# Patient Record
Sex: Male | Born: 1985 | Race: Black or African American | Hispanic: No | Marital: Single | Smoking: Current every day smoker
Health system: Southern US, Community
[De-identification: ages and names within clinical notes are randomized; demographics above are authoritative.]

## PROBLEM LIST (undated history)

## (undated) DIAGNOSIS — S022XXA Fracture of nasal bones, initial encounter for closed fracture: Secondary | ICD-10-CM

## (undated) DIAGNOSIS — E669 Obesity, unspecified: Secondary | ICD-10-CM

## (undated) DIAGNOSIS — I10 Essential (primary) hypertension: Secondary | ICD-10-CM

## (undated) DIAGNOSIS — Z972 Presence of dental prosthetic device (complete) (partial): Secondary | ICD-10-CM

## (undated) DIAGNOSIS — E119 Type 2 diabetes mellitus without complications: Secondary | ICD-10-CM

## (undated) DIAGNOSIS — S50311A Abrasion of right elbow, initial encounter: Secondary | ICD-10-CM

## (undated) HISTORY — DX: Essential (primary) hypertension: I10

## (undated) HISTORY — PX: TONSILLECTOMY: SUR1361

---

## 2008-05-30 ENCOUNTER — Emergency Department (HOSPITAL_BASED_OUTPATIENT_CLINIC_OR_DEPARTMENT_OTHER): Admission: EM | Admit: 2008-05-30 | Discharge: 2008-05-30 | Payer: Self-pay | Admitting: Emergency Medicine

## 2008-05-30 ENCOUNTER — Ambulatory Visit: Payer: Self-pay | Admitting: Diagnostic Radiology

## 2010-04-12 LAB — BASIC METABOLIC PANEL
BUN: 8 mg/dL (ref 6–23)
CO2: 29 mEq/L (ref 19–32)
Calcium: 9.6 mg/dL (ref 8.4–10.5)
Chloride: 104 mEq/L (ref 96–112)
Creatinine, Ser: 0.9 mg/dL (ref 0.4–1.5)

## 2010-04-12 LAB — CBC
MCHC: 33.3 g/dL (ref 30.0–36.0)
MCV: 87.8 fL (ref 78.0–100.0)
Platelets: 226 10*3/uL (ref 150–400)

## 2010-04-12 LAB — DIFFERENTIAL
Basophils Absolute: 0 10*3/uL (ref 0.0–0.1)
Basophils Relative: 1 % (ref 0–1)
Eosinophils Absolute: 0.2 10*3/uL (ref 0.0–0.7)
Neutrophils Relative %: 51 % (ref 43–77)

## 2010-04-12 LAB — POCT CARDIAC MARKERS: Myoglobin, poc: 30 ng/mL (ref 12–200)

## 2012-05-24 ENCOUNTER — Ambulatory Visit (INDEPENDENT_AMBULATORY_CARE_PROVIDER_SITE_OTHER): Payer: 59 | Admitting: Family Medicine

## 2012-05-24 ENCOUNTER — Encounter: Payer: Self-pay | Admitting: Family Medicine

## 2012-05-24 VITALS — BP 146/95 | HR 99 | Wt 294.0 lb

## 2012-05-24 DIAGNOSIS — R5383 Other fatigue: Secondary | ICD-10-CM

## 2012-05-24 DIAGNOSIS — E162 Hypoglycemia, unspecified: Secondary | ICD-10-CM

## 2012-05-24 DIAGNOSIS — Z13220 Encounter for screening for lipoid disorders: Secondary | ICD-10-CM

## 2012-05-24 DIAGNOSIS — R5381 Other malaise: Secondary | ICD-10-CM

## 2012-05-24 DIAGNOSIS — F17200 Nicotine dependence, unspecified, uncomplicated: Secondary | ICD-10-CM

## 2012-05-24 LAB — LIPID PANEL
Cholesterol: 219 mg/dL — ABNORMAL HIGH (ref 0–200)
HDL: 48 mg/dL (ref >39)
LDL Cholesterol: 147 mg/dL — ABNORMAL HIGH (ref 0–99)
Total CHOL/HDL Ratio: 4.6 Ratio
Triglycerides: 119 mg/dL (ref <150)
VLDL: 24 mg/dL (ref 0–40)

## 2012-05-24 LAB — CBC WITH DIFFERENTIAL/PLATELET
Basophils Absolute: 0 10*3/uL (ref 0.0–0.1)
Basophils Relative: 0 % (ref 0–1)
Eosinophils Absolute: 0.1 10*3/uL (ref 0.0–0.7)
Eosinophils Relative: 1 % (ref 0–5)
HCT: 48.4 % (ref 39.0–52.0)
Hemoglobin: 16.8 g/dL (ref 13.0–17.0)
Lymphocytes Relative: 33 % (ref 12–46)
Lymphs Abs: 3.2 10*3/uL (ref 0.7–4.0)
MCH: 28.9 pg (ref 26.0–34.0)
MCHC: 34.7 g/dL (ref 30.0–36.0)
MCV: 83.2 fL (ref 78.0–100.0)
Monocytes Absolute: 0.7 10*3/uL (ref 0.1–1.0)
Monocytes Relative: 7 % (ref 3–12)
Neutro Abs: 5.7 10*3/uL (ref 1.7–7.7)
Neutrophils Relative %: 59 % (ref 43–77)
Platelets: 270 10*3/uL (ref 150–400)
RBC: 5.82 MIL/uL — ABNORMAL HIGH (ref 4.22–5.81)
RDW: 13.9 % (ref 11.5–15.5)
WBC: 9.8 10*3/uL (ref 4.0–10.5)

## 2012-05-24 LAB — COMPLETE METABOLIC PANEL WITH GFR
ALT: 29 U/L (ref 0–53)
AST: 17 U/L (ref 0–37)
Albumin: 4.4 g/dL (ref 3.5–5.2)
Alkaline Phosphatase: 69 U/L (ref 39–117)
BUN: 9 mg/dL (ref 6–23)
CO2: 28 mEq/L (ref 19–32)
Calcium: 10.2 mg/dL (ref 8.4–10.5)
Chloride: 103 mEq/L (ref 96–112)
Creat: 1.03 mg/dL (ref 0.50–1.35)
GFR, Est African American: >89 mL/min
GFR, Est Non African American: >89 mL/min
Glucose, Bld: 90 mg/dL (ref 70–99)
Potassium: 4.6 mEq/L (ref 3.5–5.3)
Sodium: 140 mEq/L (ref 135–145)
Total Bilirubin: 0.8 mg/dL (ref 0.3–1.2)
Total Protein: 7.8 g/dL (ref 6.0–8.3)

## 2012-05-24 LAB — HEMOGLOBIN A1C
Hgb A1c MFr Bld: 5.1 % (ref <5.7)
Mean Plasma Glucose: 100 mg/dL (ref <117)

## 2012-05-24 LAB — TSH: TSH: 1.869 u[IU]/mL (ref 0.350–4.500)

## 2012-05-24 MED ORDER — VARENICLINE TARTRATE 0.5 MG X 11 & 1 MG X 42 PO MISC: ORAL | Status: DC

## 2012-05-24 NOTE — Patient Instructions (Addendum)
High Protein Diet A high protein diet means that high protein foods are added to your diet. Getting more protein in the diet is important for a number of reasons. Protein helps the body to build tissue, muscle, and to repair damage. People who have had surgery, injuries such as broken bones, infections, and burns, or illnesses such as cancer, may need more protein in their diet.  SERVING SIZES Measuring foods and serving sizes helps to make sure you are getting the right amount of food. The list below tells how big or small some common serving sizes are.   1 oz.........4 stacked dice.  3 oz.........Deck of cards.  1 tsp........Tip of little finger.  1 tbs........Thumb.  2 tbs........Golf ball.   cup.......Half of a fist.  1 cup........A fist. FOOD SOURCES OF PROTEIN Listed below are some food sources of protein and the amount of protein they contain. Your Registered Dietitian can calculate how many grams of protein you need for your medical condition. High protein foods can be added to the diet at mealtime or as snacks. Be sure to have at least 1 protein-containing food at each meal and snack to ensure adequate intake.  Meats and Meat Substitutes / Protein (g)  3 oz poultry (chicken, turkey) / 26 g  3 oz tuna, canned in water / 26 g  3 oz fish (cod) / 21 g  3 oz red meat (beef, pork) / 21 g  4 oz tofu / 9 g  1 egg / 6 g   cup egg substitute / 5 g  1 cup dried beans / 15 g  1 cup soy milk / 4 g Dairy / Protein (g)  1 cup milk (skim, 1%, 2%, whole) / 8 g   cup evaporated milk / 9 g  1 cup buttermilk / 8 g  1 cup low-fat plain yogurt / 11 g  1 cup regular plain yogurt / 9 g   cup cottage cheese / 14 g  1 oz cheddar cheese / 7 g Nuts / Protein (g)  2 tbs peanut butter / 8 g  1 oz peanuts / 7 g  2 tbs cashews / 5 g  2 tbs almonds / 5 g Document Released: 12/19/2004 Document Revised: 03/13/2011 Document Reviewed: 09/21/2006 ExitCare Patient Information  2014 [employer].  

## 2012-05-24 NOTE — Progress Notes (Signed)
  Subjective:    Patient ID: Eddie [name], male    DOB: 1985-12-30, 27 y.o.   MRN: [id number]  HPI:  The patient is here to establish care with our office.  He has a couple of issues he would like to address.    1)  Enlarged Lymph Node:  [place] noticed an enlarged lymph node behind his right ear lobe last week.  It went away after he made this appointment.    2)  Blood Sugar:  He is concerned about his blood sugar.  He says that his blood sugar drops and he has to drink orange juice to bring it up.    Review of Systems  Constitutional: Positive for fatigue and unexpected weight change. Negative for activity change and appetite change.  HENT: Negative.        Enlarged lymph node (right ear)   Eyes: Negative.   Respiratory: Negative for cough, chest tightness and shortness of breath.   Cardiovascular: Negative for chest pain, palpitations and leg swelling.  Gastrointestinal: Negative for abdominal pain, diarrhea, constipation and blood in stool.  Endocrine: Negative for polydipsia, polyphagia and polyuria.       Low blood sugar  Genitourinary: Negative for difficulty urinating.  Musculoskeletal: Negative for myalgias, joint swelling and arthralgias.  Skin: Negative for rash.  Neurological: Negative for dizziness, numbness and headaches.  Hematological: Negative.   Psychiatric/Behavioral: Negative.    Past Medical History  Diagnosis Date  . Hypertension   . Morbid obesity    Family History  Problem Relation Age of Onset  . Diabetes Mother   . Hypertension Mother   . Hypertension Father   . Cancer Father     Prostate   . Stroke Father   . 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  Morbidly obese   HENT:  Head: Normocephalic.  Right Ear: External ear normal.  Left Ear: External ear normal.  Nose: Nose normal.  Mouth/Throat: Oropharynx is clear and moist.  No node enlargement  Eyes: Conjunctivae are normal. No scleral icterus.  Neck: Neck supple. No thyromegaly present.  Cardiovascular: Normal rate, regular rhythm and normal heart sounds.   Pulmonary/Chest: Effort normal and breath sounds normal.  Abdominal: Soft. He exhibits no mass. There is no tenderness.  Musculoskeletal: Normal range of motion.  Lymphadenopathy:    He has no cervical adenopathy.  Neurological: He is alert.  Skin: Skin is warm and dry. No rash noted.  Psychiatric: He has a normal mood and affect. His behavior is normal. Judgment and thought content normal.          Assessment & Plan:

## 2012-06-16 ENCOUNTER — Encounter: Payer: Self-pay | Admitting: Family Medicine

## 2012-06-16 DIAGNOSIS — E162 Hypoglycemia, unspecified: Secondary | ICD-10-CM | POA: Insufficient documentation

## 2012-06-16 DIAGNOSIS — R5383 Other fatigue: Secondary | ICD-10-CM | POA: Insufficient documentation

## 2012-06-16 DIAGNOSIS — R5381 Other malaise: Secondary | ICD-10-CM | POA: Insufficient documentation

## 2012-06-16 DIAGNOSIS — F17200 Nicotine dependence, unspecified, uncomplicated: Secondary | ICD-10-CM | POA: Insufficient documentation

## 2012-06-16 DIAGNOSIS — Z13220 Encounter for screening for lipoid disorders: Secondary | ICD-10-CM | POA: Insufficient documentation

## 2012-06-16 NOTE — Assessment & Plan Note (Signed)
Checking a lipid panel.  

## 2012-06-16 NOTE — Assessment & Plan Note (Signed)
He is to work harder on diet and exercise.

## 2012-06-16 NOTE — Assessment & Plan Note (Signed)
Checking his blood sugar.  He was encouraged to increase his intake of protein.

## 2012-06-16 NOTE — Assessment & Plan Note (Signed)
Checking a TSH and CBC.   

## 2012-06-16 NOTE — Assessment & Plan Note (Signed)
He was given a prescription for Chantix.

## 2012-11-30 ENCOUNTER — Encounter (HOSPITAL_BASED_OUTPATIENT_CLINIC_OR_DEPARTMENT_OTHER): Payer: Self-pay | Admitting: Emergency Medicine

## 2012-11-30 ENCOUNTER — Emergency Department (HOSPITAL_BASED_OUTPATIENT_CLINIC_OR_DEPARTMENT_OTHER)
Admission: EM | Admit: 2012-11-30 | Discharge: 2012-11-30 | Disposition: A | Discharge status: Home / Self Care | Payer: 59 | Attending: Emergency Medicine | Admitting: Emergency Medicine

## 2012-11-30 DIAGNOSIS — F17200 Nicotine dependence, unspecified, uncomplicated: Secondary | ICD-10-CM | POA: Insufficient documentation

## 2012-11-30 DIAGNOSIS — J029 Acute pharyngitis, unspecified: Secondary | ICD-10-CM

## 2012-11-30 LAB — GLUCOSE, CAPILLARY: Glucose-Capillary: 104 mg/dL — ABNORMAL HIGH (ref 70–99)

## 2012-11-30 LAB — RAPID STREP SCREEN (MED CTR MEBANE ONLY): Streptococcus, Group A Screen (Direct): NEGATIVE

## 2012-11-30 NOTE — ED Provider Notes (Signed)
CSN: [id number]     Arrival date & time 11/30/12  1309 History   First MD Initiated Contact with Patient 11/30/12 1448     Chief Complaint  Patient presents with  . Sore Throat  . URI   (Consider location/radiation/quality/duration/timing/severity/associated sxs/prior Treatment) Patient is a 26 y.o. male presenting with pharyngitis. The history is provided by the patient. No language interpreter was used.  Sore Throat This is a new problem. The current episode started today. The problem occurs constantly. The problem has been gradually worsening. Associated symptoms include a sore throat. Pertinent negatives include no fever. Nothing aggravates the symptoms. He has tried nothing for the symptoms. The treatment provided moderate relief.   Pt here with Mother.  She is worried about pt's heart and about diabetes.   Pt has had a low glucose in the past and chest pain in the past.   Pt reports he had pain in his chest several weeks ago History reviewed. No pertinent past medical history. Past Surgical History  Procedure Laterality Date  . Tonsillectomy      When he was a child   Family History  Problem Relation Age of Onset  . Diabetes Mother   . Hypertension Mother   . Hypertension Father   . Cancer Father     Prostate   . Stroke Father   . Diabetes Maternal Grandmother    History  Substance Use Topics  . Smoking status: Current Every Day Smoker -- 0.50 packs/day    Types: Cigarettes  . Smokeless tobacco: Not on file     Comment: He is ready to quit.   . Alcohol Use: 1.8 oz/week    3 Glasses of wine per week     Comment: socially    Review of Systems  Constitutional: Negative for fever.  HENT: Positive for sore throat.   All other systems reviewed and are negative.    Allergies  Review of patient's allergies indicates no known allergies.  Home Medications  No current outpatient prescriptions on file. BP 153/92  Pulse 98  Temp(Src) 98.9 F (37.2 C) (Oral)  Resp 18   Ht 5\' 10"  (1.778 m)  Wt 320 lb (145.151 kg)  BMI 45.92 kg/m2  SpO2 99% Physical Exam  Nursing note and vitals reviewed. Constitutional: He is oriented to person, place, and time. He appears well-developed and well-nourished.  HENT:  Head: Normocephalic.  Eyes: Conjunctivae and EOM are normal. Pupils are equal, round, and reactive to light.  Neck: Normal range of motion. Neck supple.  Cardiovascular: Normal rate and normal heart sounds.   Pulmonary/Chest: Effort normal.  Abdominal: Soft. He exhibits no distension.  Musculoskeletal: Normal range of motion.  Neurological: He is alert and oriented to person, place, and time.  Skin: Skin is warm.  Psychiatric: He has a normal mood and affect.    ED Course  Procedures (including critical care time) Labs Review Labs Reviewed - No data to display Imaging Review No results found.  EKG Interpretation   None       MDM   1. Pharyngitis    Strep is negative,  cbg is 104.    I suspect viral illness.   Pt advised to see Dr. Alberteen Sam for complete PE.     Lonia Skinner [place], PA-C 11/30/12 [phone]

## 2012-11-30 NOTE — ED Provider Notes (Signed)
Medical screening examination/treatment/procedure(s) were performed by non-physician practitioner and as supervising physician I was immediately available for consultation/collaboration.  EKG Interpretation   None        Ethelda Chick, MD 11/30/12 [phone]

## 2012-11-30 NOTE — ED Notes (Signed)
Pt reports cough, nasal congestion and sore throat worse at night since Monday.

## 2012-12-02 LAB — CULTURE, GROUP A STREP

## 2012-12-04 ENCOUNTER — Ambulatory Visit: Payer: Self-pay | Admitting: Family Medicine

## 2012-12-11 ENCOUNTER — Ambulatory Visit (INDEPENDENT_AMBULATORY_CARE_PROVIDER_SITE_OTHER): Payer: 59 | Admitting: Family Medicine

## 2012-12-11 ENCOUNTER — Encounter: Payer: Self-pay | Admitting: Family Medicine

## 2012-12-11 VITALS — BP 146/99 | HR 93 | Resp 16 | Ht 69.5 in | Wt 308.0 lb

## 2012-12-11 DIAGNOSIS — Z23 Encounter for immunization: Secondary | ICD-10-CM

## 2012-12-11 DIAGNOSIS — E785 Hyperlipidemia, unspecified: Secondary | ICD-10-CM

## 2012-12-11 DIAGNOSIS — R070 Pain in throat: Secondary | ICD-10-CM

## 2012-12-11 DIAGNOSIS — I10 Essential (primary) hypertension: Secondary | ICD-10-CM

## 2012-12-11 DIAGNOSIS — J3489 Other specified disorders of nose and nasal sinuses: Secondary | ICD-10-CM

## 2012-12-11 DIAGNOSIS — R0981 Nasal congestion: Secondary | ICD-10-CM

## 2012-12-11 DIAGNOSIS — Z0000 Encounter for general adult medical examination without abnormal findings: Secondary | ICD-10-CM

## 2012-12-11 LAB — POCT URINALYSIS DIPSTICK
Bilirubin, UA: NEGATIVE
Blood, UA: NEGATIVE
Glucose, UA: NEGATIVE
Ketones, UA: NEGATIVE
Leukocytes, UA: NEGATIVE
Nitrite, UA: NEGATIVE
Protein, UA: NEGATIVE
Spec Grav, UA: 1.02
Urobilinogen, UA: NEGATIVE
pH, UA: 7

## 2012-12-11 MED ORDER — HYDROCHLOROTHIAZIDE 25 MG PO TABS: 25.0000 mg | ORAL_TABLET | Freq: Every day | ORAL | Status: DC

## 2012-12-11 NOTE — Assessment & Plan Note (Signed)
He received his Boostrix without difficulty.  He was given a handout discussing possible side effects.  

## 2012-12-11 NOTE — Progress Notes (Signed)
Subjective:    Patient ID: Eddie [name], male    DOB: [birth year], 27 y.o.   MRN: [id number]  HPI  Eddie [name] is here today for his annual CPE with lab results.  Overall, he feels that his health is good.  His only complaint today is throat pain.  He was seen at the [facility] ER a few weeks ago for this problems.  He was told that his throat was negative for strep and that he should take Tylenol to help with the pain.  He said his throat is feeling 95% better but is still taking Tylenol occasionally.     Review of Systems  Constitutional: Negative for appetite change, fatigue and unexpected weight change.  HENT: Positive for sore throat.   Eyes: Negative for visual disturbance.  Respiratory: Negative for chest tightness and shortness of breath.   Cardiovascular: Negative for chest pain, palpitations and leg swelling.  Gastrointestinal: Negative for abdominal pain, diarrhea, constipation and blood in stool.  Endocrine: Negative.   Genitourinary: Negative for discharge, difficulty urinating and genital sores.  Allergic/Immunologic: Negative.   Neurological: Negative for dizziness and headaches.  Hematological: Negative.   Psychiatric/Behavioral: Negative for sleep disturbance and decreased concentration. The patient is not nervous/anxious.   All other systems reviewed and are negative.     Past Medical History  Diagnosis Date  . Hyperlipidemia   . Morbid obesity   . Hypertension      Past Surgical History  Procedure Laterality Date  . 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 Diabetes Mother   . Hypertension Mother   . Hypertension Father   . Cancer Father     Prostate   . Stroke Father   . Diabetes Maternal Grandmother      No Known Allergies   Immunization History  Administered Date(s) Administered  . Tdap 12/11/2012      Objective:   Physical Exam  Constitutional: He is oriented to person, place, and time. He appears well-developed and well-nourished. No distress.  HENT:  Nose: Nose normal.  Mouth/Throat: No oropharyngeal exudate.  Eyes: Conjunctivae are normal. No scleral icterus.  Neck: Normal range of motion. Neck supple. No thyromegaly present.  Cardiovascular: Normal rate, regular rhythm and normal heart sounds.   No murmur heard. Pulmonary/Chest: Effort normal and breath sounds normal.  Abdominal: Soft. Bowel sounds are normal. He exhibits no mass. There is no tenderness. Hernia confirmed negative in the right inguinal area and confirmed negative in the left inguinal area.  Genitourinary: Testes normal and penis normal. Circumcised.  Musculoskeletal: Normal range of motion. He exhibits no edema and no tenderness.  Lymphadenopathy:    He has no cervical adenopathy.       Right: No inguinal adenopathy present.  Left: No inguinal adenopathy present.  Neurological: He is alert and oriented to person, place, and time.  Skin: No rash noted.     Psychiatric: He has a normal mood and affect. His behavior is normal. Judgment and thought content normal.      Assessment & Plan:   Eddie [name] was seen today for annual exam.  Diagnoses and associated orders for this visit:  Routine general medical examination at a health care facility Comments: Normal exam - POCT urinalysis dipstick  Essential hypertension, benign Comments: His BP remains elevated so he was given a prescription for HCTZ.  - Discontinue: hydrochlorothiazide  (HYDRODIURIL) 25 MG tablet; Take 1 tablet (25 mg total) by mouth daily.  Head congestion Comments: He is to use a Lloyd Huger Med Sinus Rinse.    Other and unspecified hyperlipidemia Comments: He was encouraged to follow a low fat diet.    Throat pain Comments: His throat pain has improved.  We discussed that his pain might be due to GERD.  He is to try an OTC acid reducer.    Need for prophylactic vaccination with combined diphtheria-tetanus-pertussis (DTP) vaccine - Tdap vaccine greater than or equal to 7yo IM   TIME SPENT "FACE TO FACE" WITH PATIENT -  45 MINS

## 2012-12-11 NOTE — Patient Instructions (Addendum)
1)  BP -  Since your BP has been high the last 3 readings that I have seen, I am going to start you on a diuretic (HCTZ 25 mg) per day. Decrease your intake of sodium and F/U in 3 months for a recheck of your pressure.  STOP SMOKING, decrease intake of alcohol , lose weight and exercise.    2)  Weight - Consider bariatric surgery; Look into attending a seminar.   3)  Hyperlipidemia  - Low Fat Diet.    4)  Head Congestion - Lloyd Huger Med Sinus Rinse (Distilled water plus blue packet), Zyrtec 10 mg at night  5)  Sore Throat - Try an acid reducer (ex. Zantac 300 mg vs Pepcid 40 mg vs Prilosec 40 mg)   3 to 4 Gram Sodium Diet, No Added Salt (NAS) A 3 to 4 gram sodium diet restricts the amount of sodium in the diet to no more than 3 to 4 g or 3000 to 4000 mg daily. Limiting the amount of sodium is often used to help lower blood pressure. It is important if you have heart, liver, or kidney problems. Many foods contain sodium for flavor and sometimes as a preservative. When the amount of sodium in a diet needs to be low, it is important to know what to look for when choosing foods and drinks. The following includes some information and guidelines to help make it easier for you to adapt to a low sodium diet. QUICK TIPS  Do not add salt to food.  Avoid convenience items and fast food.  Choose unsalted snack foods.  Buy lower sodium products, often labeled as "lower sodium" or "no salt added."  Check food labels to learn how much sodium is in 1 serving.  When eating at a restaurant, ask that your food be prepared with less salt or none, if possible. READING FOOD LABELS FOR SODIUM INFORMATION The nutrition facts label is a good place to find how much sodium is in foods. Look for products with no more than 500 to 600 mg of sodium per meal and no more than 150 mg per serving. Remember that 3 to 4 g = 3000 to 4000 mg. The food label may also list foods as:  Sodium-free: Less than 5 mg in a  serving.  Very low sodium: 35 mg or less in a serving.  Low-sodium: 140 mg or less in a serving.  Light in sodium: 50% less sodium in a serving. For example, if a food that usually has 300 mg of sodium is changed to become light in sodium, it will have 150 mg of sodium.  Reduced sodium: 25% less sodium in a serving. For example, if a food that usually has 400 mg of sodium is changed to reduced sodium, it will have 300 mg of sodium. CHOOSING FOODS Grains  Avoid: Salted crackers and snack items. Bread stuffing and biscuit mixes. Seasoned rice or pasta mixes.  Choose: Unsalted snack items. English muffins, breads, and rolls. Homemade pancakes and waffles. Most cereals. Pasta. Meats  Avoid: Salted, canned, smoked, spiced, pickled meats, including fish and poultry. Bacon, ham, sausage, cold cuts, hot dogs, anchovies.  Choose: Low-sodium canned tuna and salmon. Fresh or frozen meat, poultry, and fish. Dairy  Avoid: Processed cheese and spreads. Cottage cheese. Buttermilk and condensed milk. Regular cheese.  Choose: Milk. Low-sodium cottage cheese. Yogurt. Sour cream. Low-sodium cheese. Fruits and Vegetables  Avoid: Regular canned vegetables. Regular canned tomato sauce and paste. Frozen vegetables in sauces. Olives.  Rosita Fire. Relishes. Sauerkraut.  Choose: Low-sodium canned vegetables. Low-sodium tomato sauce and paste. Frozen or fresh vegetables. Fresh and frozen fruit. Condiments  Avoid: Canned and packaged gravies. Worcestershire sauce. Tartar sauce. Barbecue sauce. Soy sauce. Steak sauce. Ketchup. Onion, garlic, and table salt. Meat flavorings and tenderizers.  Choose: Fresh and dried herbs and spices. Low-sodium varieties of mustard and ketchup. Lemon juice. Tabasco sauce. Horseradish. SAMPLE 3 TO 4 GRAM SODIUM MEAL PLAN  Breakfast / Sodium (mg)  1 cup low-fat milk / 143 mg  2 slices whole-wheat toast / 270 mg  1 tbs heart-healthy margarine / 153 mg  1 hard-boiled egg / 139  mg Lunch / Sodium (mg)  1 cup raw carrots / 76 mg   cup hummus / 298 mg  1 cup low-fat milk / 143 mg   cup red grapes / 2 mg  1 cup low-sodium chicken and rice soup / 480 mg  10 low-sodium saltine crackers / 191 mg Dinner / Sodium (mg)  1 cup whole-wheat pasta / 2 mg  1 cup tomato sauce / 1178 mg  3 oz lean ground beef / 57 mg  1 small side salad (1 cup raw spinach leaves,  cup cucumber,  cup yellow bell pepper) / 25 mg  1 tsp ranch dressing / 144 mg Snack / Sodium (mg)  1 slice cheddar cheese / 258 mg  1 medium apple / 1 mg Nutrient Analysis  Calories: 2005  Protein: 85 g  Carbohydrate: 245 g  Fat: 78 g  Sodium: 3560 mg Document Released: 12/19/2004 Document Revised: 03/13/2011 Document Reviewed: 03/22/2009 Exit[employer] Patient Information 2014 [place], [place].  Fat and Cholesterol Control Diet Your diet has an affect on your fat and cholesterol levels in your blood and organs. Too much fat and cholesterol in your blood can affect your:  Heart.  Blood vessels (arteries, veins).  Gallbladder.  Liver.  Pancreas. CONTROL FAT AND CHOLESTEROL WITH DIET Certain foods raise cholesterol and others lower it. It is important to replace bad fats with other types of fat.  Do not eat:  Fatty meats, such as hot dogs and salami.  Stick margarine and some tub margarines that have "partially hydrogenated oils" in them.  Baked goods, such as cookies and crackers that have "partially hydrogenated oils" in them.  Saturated tropical oils, such as coconut and palm oil. Eat the following foods:  Round or loin cuts of red meat.  Chicken (without skin).  Fish.  Veal.  Ground [place] breast.  Shellfish.  Fruit, such as apples.  Vegetables, such as broccoli, potatoes, and carrots.  Beans, peas, and lentils (legumes).  Grains, such as barley, rice, couscous, and bulgar wheat.  Pasta (without cream sauces). Look for foods that are nonfat, low in fat, and  low in cholesterol.  FIND FOODS THAT ARE LOWER IN FAT AND CHOLESTEROL  Find foods with soluble fiber and plant sterols (phytosterol). You should eat 2 grams a day of these foods. These foods include:  Fruits.  Vegetables.  Whole grains.  Dried beans and peas.  Nuts and seeds.  Read package labels. Look for low-saturated fats, trans fat free, low-fat foods.  Choose cheese that have only 2 to 3 grams of saturated fat per ounce.  Use heart-healthy tub margarine that is free of trans fat or partially hydrogenated oil.  Avoid buying baked goods that have partially hydrogenated oils in them. Instead, buy baked goods made with whole grains (whole-wheat or whole oat flour). Avoid baked goods labeled with "flour"  or "enriched flour."  Buy non-creamy canned soups with reduced salt and no added fats. PREPARING YOUR FOOD  Broil, bake, steam, or roast foods. Do not fry food.  Use non-stick cooking sprays.  Use lemon or herbs to flavor food instead of using butter or stick margarine.  Use nonfat yogurt, salsa, or low-fat dressings for salads. LOW-SATURATED FAT / LOW-FAT FOOD SUBSTITUTES  Meats / Saturated Fat (g)  Avoid: Steak, marbled (3 oz/85 g) / 11 g.  Choose: Steak, lean (3 oz/85 g) / 4 g.  Avoid: Hamburger (3 oz/85 g) / 7 g.  Choose: Hamburger, lean (3 oz/85 g) / 5 g.  Avoid: Ham (3 oz/85 g) / 6 g.  Choose: Ham, lean cut (3 oz/85 g) / 2.4 g.  Avoid: Chicken, with skin, dark meat (3 oz/85 g) / 4 g.  Choose: Chicken, skin removed, dark meat (3 oz/85 g) / 2 g.  Avoid: Chicken, with skin, light meat (3 oz/85 g) / 2.5 g.  Choose: Chicken, skin removed, light meat (3 oz/85 g) / 1 g. Dairy / Saturated Fat (g)  Avoid: Whole milk (1 cup) / 5 g.  Choose: Low-fat milk, 2% (1 cup) / 3 g.  Choose: Low-fat milk, 1% (1 cup) / 1.5 g.  Choose: Skim milk (1 cup) / 0.3 g.  Avoid: Hard cheese (1 oz/28 g) / 6 g.  Choose: Skim milk cheese (1 oz/28 g) / 2 to 3 g.  Avoid: Cottage  cheese, 4% fat (1 cup) / 6.5 g.  Choose: Low-fat cottage cheese, 1% fat (1 cup) / 1.5 g.  Avoid: Ice cream (1 cup) / 9 g.  Choose: Sherbet (1 cup) / 2.5 g.  Choose: Nonfat frozen yogurt (1 cup) / 0.3 g.  Choose: Frozen fruit bar / trace.  Avoid: Whipped cream (1 tbs) / 3.5 g.  Choose: Nondairy whipped topping (1 tbs) / 1 g. Condiments / Saturated Fat (g)  Avoid: Mayonnaise (1 tbs) / 2 g.  Choose: Low-fat mayonnaise (1 tbs) / 1 g.  Avoid: Butter (1 tbs) / 7 g.  Choose: Extra light margarine (1 tbs) / 1 g.  Avoid: Coconut oil (1 tbs) / 11.8 g.  Choose: Olive oil (1 tbs) / 1.8 g.  Choose: Corn oil (1 tbs) / 1.7 g.  Choose: Safflower oil (1 tbs) / 1.2 g.  Choose: Sunflower oil (1 tbs) / 1.4 g.  Choose: Soybean oil (1 tbs) / 2.4 g .  Choose: Canola oil (1 tbs) / 1 g. Document Released: 06/20/2011 Document Revised: 08/21/2012 Document Reviewed: 06/20/2011 [facility] Patient Information 2014 [place], [place].

## 2013-02-10 ENCOUNTER — Other Ambulatory Visit: Payer: Self-pay | Admitting: *Deleted

## 2013-02-10 DIAGNOSIS — I10 Essential (primary) hypertension: Secondary | ICD-10-CM

## 2013-02-10 MED ORDER — HYDROCHLOROTHIAZIDE 25 MG PO TABS: 25.0000 mg | ORAL_TABLET | Freq: Every day | ORAL | Status: DC

## 2013-03-02 DIAGNOSIS — I10 Essential (primary) hypertension: Secondary | ICD-10-CM | POA: Insufficient documentation

## 2013-03-02 DIAGNOSIS — E785 Hyperlipidemia, unspecified: Secondary | ICD-10-CM | POA: Insufficient documentation

## 2013-03-02 DIAGNOSIS — R070 Pain in throat: Secondary | ICD-10-CM | POA: Insufficient documentation

## 2013-03-02 DIAGNOSIS — R0981 Nasal congestion: Secondary | ICD-10-CM | POA: Insufficient documentation

## 2013-03-05 ENCOUNTER — Encounter: Payer: Self-pay | Admitting: Family Medicine

## 2013-03-05 ENCOUNTER — Ambulatory Visit (INDEPENDENT_AMBULATORY_CARE_PROVIDER_SITE_OTHER): Payer: 59 | Admitting: Family Medicine

## 2013-03-05 VITALS — BP 131/93 | HR 106 | Resp 18 | Ht 70.5 in | Wt 308.0 lb

## 2013-03-05 DIAGNOSIS — I10 Essential (primary) hypertension: Secondary | ICD-10-CM

## 2013-03-05 MED ORDER — ATENOLOL-CHLORTHALIDONE 50-25 MG PO TABS: 1.0000 | ORAL_TABLET | Freq: Every day | ORAL | Status: DC

## 2013-03-05 NOTE — Patient Instructions (Addendum)
1)  Hypertension - Since your BP is still too high, we are changing you to atenolol/chlorthalidone once a day.  If you change your diet and decrease your sodium and notice that your pressure is much lower then take 1/2 tab.      3 to 4 Gram Sodium Diet, No Added Salt (NAS) A 3 to 4 gram sodium diet restricts the amount of sodium in the diet to no more than 3 to 4 g or 3000 to 4000 mg daily. Limiting the amount of sodium is often used to help lower blood pressure. It is important if you have heart, liver, or kidney problems. Many foods contain sodium for flavor and sometimes as a preservative. When the amount of sodium in a diet needs to be low, it is important to know what to look for when choosing foods and drinks. The following includes some information and guidelines to help make it easier for you to adapt to a low sodium diet. QUICK TIPS  Do not add salt to food.  Avoid convenience items and fast food.  Choose unsalted snack foods.  Buy lower sodium products, often labeled as "lower sodium" or "no salt added."  Check food labels to learn how much sodium is in 1 serving.  When eating at a restaurant, ask that your food be prepared with less salt or none, if possible. READING FOOD LABELS FOR SODIUM INFORMATION The nutrition facts label is a good place to find how much sodium is in foods. Look for products with no more than 500 to 600 mg of sodium per meal and no more than 150 mg per serving. Remember that 3 to 4 g = 3000 to 4000 mg. The food label may also list foods as:  Sodium-free: Less than 5 mg in a serving.  Very low sodium: 35 mg or less in a serving.  Low-sodium: 140 mg or less in a serving.  Light in sodium: 50% less sodium in a serving. For example, if a food that usually has 300 mg of sodium is changed to become light in sodium, it will have 150 mg of sodium.  Reduced sodium: 25% less sodium in a serving. For example, if a food that usually has 400 mg of sodium is changed  to reduced sodium, it will have 300 mg of sodium. CHOOSING FOODS Grains  Avoid: Salted crackers and snack items. Bread stuffing and biscuit mixes. Seasoned rice or pasta mixes.  Choose: Unsalted snack items. English muffins, breads, and rolls. Homemade pancakes and waffles. Most cereals. Pasta. Meats  Avoid: Salted, canned, smoked, spiced, pickled meats, including fish and poultry. Bacon, ham, sausage, cold cuts, hot dogs, anchovies.  Choose: Low-sodium canned tuna and salmon. Fresh or frozen meat, poultry, and fish. Dairy  Avoid: Processed cheese and spreads. Cottage cheese. Buttermilk and condensed milk. Regular cheese.  Choose: Milk. Low-sodium cottage cheese. Yogurt. Sour cream. Low-sodium cheese. Fruits and Vegetables  Avoid: Regular canned vegetables. Regular canned tomato sauce and paste. Frozen vegetables in sauces. Olives. Rosita Fire. Relishes. Sauerkraut.  Choose: Low-sodium canned vegetables. Low-sodium tomato sauce and paste. Frozen or fresh vegetables. Fresh and frozen fruit. Condiments  Avoid: Canned and packaged gravies. Worcestershire sauce. Tartar sauce. Barbecue sauce. Soy sauce. Steak sauce. Ketchup. Onion, garlic, and table salt. Meat flavorings and tenderizers.  Choose: Fresh and dried herbs and spices. Low-sodium varieties of mustard and ketchup. Lemon juice. Tabasco sauce. Horseradish. SAMPLE 3 TO 4 GRAM SODIUM MEAL PLAN  Breakfast / Sodium (mg)  1 cup low-fat milk /  143 mg  2 slices whole-wheat toast / 270 mg  1 tbs heart-healthy margarine / 153 mg  1 hard-boiled egg / 139 mg Lunch / Sodium (mg)  1 cup raw carrots / 76 mg   cup hummus / 298 mg  1 cup low-fat milk / 143 mg   cup red grapes / 2 mg  1 cup low-sodium chicken and rice soup / 480 mg  10 low-sodium saltine crackers / 191 mg Dinner / Sodium (mg)  1 cup whole-wheat pasta / 2 mg  1 cup tomato sauce / 1178 mg  3 oz lean ground beef / 57 mg  1 small side salad (1 cup raw spinach  leaves,  cup cucumber,  cup yellow bell pepper) / 25 mg  1 tsp ranch dressing / 144 mg Snack / Sodium (mg)  1 slice cheddar cheese / 258 mg  1 medium apple / 1 mg Nutrient Analysis  Calories: 2005  Protein: 85 g  Carbohydrate: 245 g  Fat: 78 g  Sodium: 3560 mg Document Released: 12/19/2004 Document Revised: 03/13/2011 Document Reviewed: 03/22/2009 Exit[employer] Patient Information 2014 [employer] [place].

## 2013-03-05 NOTE — Progress Notes (Signed)
   Subjective:    Patient ID: Eddie [name], male    DOB: 04-Apr-1985, 28 y.o.   MRN: [id number]  HPI  Eddie [name] is here today to follow up on his blood pressure which remains elevated even though he is taking HCTZ daily.  He has not been monitoring his pressure.  He denies any headaches or dizziness.  He says that his job is very stressful and he is currently looking for another job. He needs a refill for his HCTZ.     Review of Systems  Constitutional: Negative for activity change and appetite change.  Cardiovascular: Negative for chest pain, palpitations and leg swelling.  Neurological: Negative for dizziness, light-headedness and headaches.  Psychiatric/Behavioral: The patient is not nervous/anxious.   All other systems reviewed and are negative.     Past Medical History  Diagnosis Date  . Hyperlipidemia   . Morbid obesity   . Hypertension      Past Surgical History  Procedure Laterality Date  . 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 Diabetes Mother   . Hypertension Mother   . Hypertension Father   . Cancer Father     Prostate   . Stroke Father   . Diabetes Maternal Grandmother      Current Outpatient Prescriptions on File Prior to Visit  Medication Sig Dispense Refill  . hydrochlorothiazide (HYDRODIURIL) 25 MG tablet Take 1 tablet (25 mg total) by mouth daily.  30 tablet  0   No current facility-administered medications on file prior to visit.     No Known  Allergies   Immunization History  Administered Date(s) Administered  . Tdap 12/11/2012       Objective:   Physical Exam  Constitutional: He is oriented to person, place, and time. He appears well-nourished. No distress.  HENT:  Head: Normocephalic.  Eyes: No scleral icterus.  Neck: Neck supple. No thyromegaly present.  Cardiovascular: Normal rate, regular rhythm and normal heart sounds.  Exam reveals no gallop and no friction rub.   No murmur heard. Pulmonary/Chest: Breath sounds normal. No respiratory distress. He exhibits no tenderness.  Musculoskeletal: He exhibits no edema.  Neurological: He is alert and oriented to person, place, and time.  Skin: Skin is warm and dry. No rash noted.  Psychiatric: He has a normal mood and affect. His behavior is normal. Judgment and thought content normal.      Assessment & Plan:    Eddie [name] was seen today for hypertension.  Diagnoses and associated orders for this visit:  Essential hypertension, benign - atenolol-chlorthalidone (TENORETIC) 50-25 MG per tablet; Take 1 tablet by mouth daily.

## 2013-06-24 ENCOUNTER — Encounter (HOSPITAL_BASED_OUTPATIENT_CLINIC_OR_DEPARTMENT_OTHER): Payer: Self-pay | Admitting: Emergency Medicine

## 2013-06-24 ENCOUNTER — Other Ambulatory Visit: Payer: Self-pay | Admitting: *Deleted

## 2013-06-24 ENCOUNTER — Emergency Department (HOSPITAL_BASED_OUTPATIENT_CLINIC_OR_DEPARTMENT_OTHER)
Admission: EM | Admit: 2013-06-24 | Discharge: 2013-06-24 | Disposition: A | Discharge status: Home / Self Care | Payer: 59 | Attending: Emergency Medicine | Admitting: Emergency Medicine

## 2013-06-24 ENCOUNTER — Ambulatory Visit: Payer: 59 | Admitting: Family Medicine

## 2013-06-24 ENCOUNTER — Other Ambulatory Visit: Payer: Self-pay | Admitting: Family Medicine

## 2013-06-24 DIAGNOSIS — E089 Diabetes mellitus due to underlying condition without complications: Secondary | ICD-10-CM

## 2013-06-24 DIAGNOSIS — E1165 Type 2 diabetes mellitus with hyperglycemia: Principal | ICD-10-CM

## 2013-06-24 DIAGNOSIS — Z794 Long term (current) use of insulin: Principal | ICD-10-CM

## 2013-06-24 DIAGNOSIS — I10 Essential (primary) hypertension: Secondary | ICD-10-CM | POA: Insufficient documentation

## 2013-06-24 DIAGNOSIS — Z79899 Other long term (current) drug therapy: Secondary | ICD-10-CM | POA: Insufficient documentation

## 2013-06-24 DIAGNOSIS — IMO0001 Reserved for inherently not codable concepts without codable children: Secondary | ICD-10-CM

## 2013-06-24 DIAGNOSIS — E119 Type 2 diabetes mellitus without complications: Secondary | ICD-10-CM | POA: Insufficient documentation

## 2013-06-24 DIAGNOSIS — F17200 Nicotine dependence, unspecified, uncomplicated: Secondary | ICD-10-CM | POA: Insufficient documentation

## 2013-06-24 DIAGNOSIS — H539 Unspecified visual disturbance: Secondary | ICD-10-CM | POA: Insufficient documentation

## 2013-06-24 DIAGNOSIS — R000 Tachycardia, unspecified: Secondary | ICD-10-CM | POA: Insufficient documentation

## 2013-06-24 LAB — COMPREHENSIVE METABOLIC PANEL
ALBUMIN: 4.7 g/dL (ref 3.5–5.2)
ALK PHOS: 98 U/L (ref 39–117)
ALT: 42 U/L (ref 0–53)
AST: 28 U/L (ref 0–37)
BILIRUBIN TOTAL: 0.4 mg/dL (ref 0.3–1.2)
BUN: 11 mg/dL (ref 6–23)
CHLORIDE: 92 meq/L — AB (ref 96–112)
CO2: 22 meq/L (ref 19–32)
Calcium: 10.1 mg/dL (ref 8.4–10.5)
Creatinine, Ser: 0.9 mg/dL (ref 0.50–1.35)
GFR calc Af Amer: >90 mL/min (ref >90)
Glucose, Bld: 384 mg/dL — ABNORMAL HIGH (ref 70–99)
POTASSIUM: 3.6 meq/L — AB (ref 3.7–5.3)
Sodium: 137 mEq/L (ref 137–147)
Total Protein: 9 g/dL — ABNORMAL HIGH (ref 6.0–8.3)

## 2013-06-24 LAB — CBC WITH DIFFERENTIAL/PLATELET
BASOS PCT: 1 % (ref 0–1)
Basophils Absolute: 0.1 10*3/uL (ref 0.0–0.1)
Eosinophils Absolute: 0.1 10*3/uL (ref 0.0–0.7)
Eosinophils Relative: 2 % (ref 0–5)
HEMATOCRIT: 48 % (ref 39.0–52.0)
HEMOGLOBIN: 16.7 g/dL (ref 13.0–17.0)
LYMPHS ABS: 2.7 10*3/uL (ref 0.7–4.0)
LYMPHS PCT: 32 % (ref 12–46)
MCH: 28.7 pg (ref 26.0–34.0)
MCHC: 34.8 g/dL (ref 30.0–36.0)
MCV: 82.6 fL (ref 78.0–100.0)
MONO ABS: 0.7 10*3/uL (ref 0.1–1.0)
MONOS PCT: 8 % (ref 3–12)
NEUTROS ABS: 5.1 10*3/uL (ref 1.7–7.7)
NEUTROS PCT: 59 % (ref 43–77)
Platelets: 235 10*3/uL (ref 150–400)
RBC: 5.81 MIL/uL (ref 4.22–5.81)
RDW: 13.2 % (ref 11.5–15.5)
WBC: 8.7 10*3/uL (ref 4.0–10.5)

## 2013-06-24 LAB — URINE MICROSCOPIC-ADD ON

## 2013-06-24 LAB — URINALYSIS, ROUTINE W REFLEX MICROSCOPIC
Bilirubin Urine: NEGATIVE
HGB URINE DIPSTICK: NEGATIVE
LEUKOCYTES UA: NEGATIVE
Nitrite: NEGATIVE
PROTEIN: 30 mg/dL — AB
Specific Gravity, Urine: 1.045 — ABNORMAL HIGH (ref 1.005–1.030)
UROBILINOGEN UA: 1 mg/dL (ref 0.0–1.0)
pH: 6 (ref 5.0–8.0)

## 2013-06-24 LAB — CBG MONITORING, ED
GLUCOSE-CAPILLARY: 297 mg/dL — AB (ref 70–99)
Glucose-Capillary: 352 mg/dL — ABNORMAL HIGH (ref 70–99)
Glucose-Capillary: 275 mg/dL — ABNORMAL HIGH (ref 70–99)

## 2013-06-24 LAB — HEMOGLOBIN A1C
Hgb A1c MFr Bld: 9.7 % — ABNORMAL HIGH (ref <5.7)
Mean Plasma Glucose: 232 mg/dL — ABNORMAL HIGH (ref <117)

## 2013-06-24 MED ORDER — SODIUM CHLORIDE 0.9 % IV BOLUS (SEPSIS)
1000.0000 mL | Freq: Once | INTRAVENOUS | Status: AC
  Administered 2013-06-24: 1000 mL via INTRAVENOUS

## 2013-06-24 MED ORDER — METFORMIN HCL 500 MG PO TABS: 500.0000 mg | ORAL_TABLET | Freq: Two times a day (BID) | ORAL | Status: DC

## 2013-06-24 NOTE — ED Provider Notes (Signed)
CSN: [id number]     Arrival date & time 06/24/13  1017 History   First MD Initiated Contact with Patient 06/24/13 1049     Chief Complaint  Patient presents with  . Hyperglycemia     (Consider location/radiation/quality/duration/timing/severity/associated sxs/prior Treatment) HPI Comments: Patient presents with an elevated blood sugar. He states he had a normal blood sugar about a year ago and over the last 2 days he's had increased urination and increased thirst. Yesterday started noticing that his vision was blurred. He checks his blood sugar today and it was elevated to 354. He has no known history of diabetes. He denies any chest pain shortness of breath or other recent illnesses.  Patient is a 28 y.o. male presenting with hyperglycemia.  Hyperglycemia Associated symptoms: increased thirst and polyuria   Associated symptoms: no abdominal pain, no chest pain, no diaphoresis, no dizziness, no fatigue, no fever, no nausea, no shortness of breath and no vomiting     Past Medical History  Diagnosis Date  . Hyperlipidemia   . Morbid obesity   . Hypertension    Past Surgical History  Procedure Laterality Date  . Tonsillectomy      When he was a child   Family History  Problem Relation Age of Onset  . Diabetes Mother   . Hypertension Mother   . Hypertension Father   . Cancer Father     Prostate   . Stroke Father   . Diabetes Maternal Grandmother    History  Substance Use Topics  . Smoking status: Current Every Day Smoker -- 0.25 packs/day for 7 years    Types: Cigarettes  . Smokeless tobacco: Never Used     Comment: He smokes 3 per day.    . Alcohol Use: 1.8 oz/week    3 Glasses of wine per week     Comment: Socially     Review of Systems  Constitutional: Negative for fever, chills, diaphoresis and fatigue.  HENT: Negative for congestion, rhinorrhea and sneezing.   Eyes: Positive for visual disturbance.  Respiratory: Negative for cough, chest tightness and shortness  of breath.   Cardiovascular: Negative for chest pain and leg swelling.  Gastrointestinal: Negative for nausea, vomiting, abdominal pain, diarrhea and blood in stool.  Endocrine: Positive for polydipsia and polyuria.  Genitourinary: Negative for frequency, hematuria, flank pain and difficulty urinating.  Musculoskeletal: Negative for arthralgias and back pain.  Skin: Negative for rash.  Neurological: Negative for dizziness, speech difficulty, weakness, numbness and headaches.      Allergies  Review of patient's allergies indicates no known allergies.  Home Medications   Prior to Admission medications   Medication Sig Start Date End Date Taking? Authorizing Provider  atenolol-chlorthalidone (TENORETIC) 50-25 MG per tablet Take 1 tablet by mouth daily. 03/05/13 03/06/14  Gillian Scarceobyn K Zanard, MD  hydrochlorothiazide (HYDRODIURIL) 25 MG tablet Take 1 tablet (25 mg total) by mouth daily. 02/10/13 02/10/14  Gillian Scarceobyn K Zanard, MD  metFORMIN (GLUCOPHAGE) 500 MG tablet Take 1 tablet (500 mg total) by mouth 2 (two) times daily with a meal. 06/24/13   Rolan BuccoMelanie Belfi, MD   BP 155/103  Pulse 99  Temp(Src) 99.2 F (37.3 C) (Oral)  Resp 20  Ht 5\' 10"  (1.778 m)  Wt 301 lb (136.533 kg)  BMI 43.19 kg/m2  SpO2 100% Physical Exam  Constitutional: He is oriented to person, place, and time. He appears well-developed and well-nourished.  HENT:  Head: Normocephalic and atraumatic.  Eyes: Pupils are equal, round, and reactive to  light.  Neck: Normal range of motion. Neck supple.  Cardiovascular: Regular rhythm and normal heart sounds.  Tachycardia present.   Pulmonary/Chest: Effort normal and breath sounds normal. No respiratory distress. He has no wheezes. He has no rales. He exhibits no tenderness.  Abdominal: Soft. Bowel sounds are normal. There is no tenderness. There is no rebound and no guarding.  Musculoskeletal: Normal range of motion. He exhibits no edema.  Lymphadenopathy:    He has no cervical adenopathy.   Neurological: He is alert and oriented to person, place, and time.  Skin: Skin is warm and dry. No rash noted.  Psychiatric: He has a normal mood and affect.    ED Course  Procedures (including critical care time) Labs Review Results for orders placed during the hospital encounter of 06/24/13  CBC WITH DIFFERENTIAL      Result Value Ref Range   WBC 8.7  4.0 - 10.5 K/uL   RBC 5.81  4.22 - 5.81 MIL/uL   Hemoglobin 16.7  13.0 - 17.0 g/dL   HCT 08.648.0  57.839.0 - 46.952.0 %   MCV 82.6  78.0 - 100.0 fL   MCH 28.7  26.0 - 34.0 pg   MCHC 34.8  30.0 - 36.0 g/dL   RDW 62.913.2  52.811.5 - 41.315.5 %   Platelets 235  150 - 400 K/uL   Neutrophils Relative % 59  43 - 77 %   Neutro Abs 5.1  1.7 - 7.7 K/uL   Lymphocytes Relative 32  12 - 46 %   Lymphs Abs 2.7  0.7 - 4.0 K/uL   Monocytes Relative 8  3 - 12 %   Monocytes Absolute 0.7  0.1 - 1.0 K/uL   Eosinophils Relative 2  0 - 5 %   Eosinophils Absolute 0.1  0.0 - 0.7 K/uL   Basophils Relative 1  0 - 1 %   Basophils Absolute 0.1  0.0 - 0.1 K/uL  COMPREHENSIVE METABOLIC PANEL      Result Value Ref Range   Sodium 137  137 - 147 mEq/L   Potassium 3.6 (*) 3.7 - 5.3 mEq/L   Chloride 92 (*) 96 - 112 mEq/L   CO2 22  19 - 32 mEq/L   Glucose, Bld 384 (*) 70 - 99 mg/dL   BUN 11  6 - 23 mg/dL   Creatinine, Ser 2.440.90  0.50 - 1.35 mg/dL   Calcium 01.010.1  8.4 - 27.210.5 mg/dL   Total Protein 9.0 (*) 6.0 - 8.3 g/dL   Albumin 4.7  3.5 - 5.2 g/dL   AST 28  0 - 37 U/L   ALT 42  0 - 53 U/L   Alkaline Phosphatase 98  39 - 117 U/L   Total Bilirubin 0.4  0.3 - 1.2 mg/dL   GFR calc non Af Amer >90  >90 mL/min   GFR calc Af Amer >90  >90 mL/min  URINALYSIS, ROUTINE W REFLEX MICROSCOPIC      Result Value Ref Range   Color, Urine YELLOW  YELLOW   APPearance CLEAR  CLEAR   Specific Gravity, Urine 1.045 (*) 1.005 - 1.030   pH 6.0  5.0 - 8.0   Glucose, UA >1000 (*) NEGATIVE mg/dL   Hgb urine dipstick NEGATIVE  NEGATIVE   Bilirubin Urine NEGATIVE  NEGATIVE   Ketones, ur >80 (*)  NEGATIVE mg/dL   Protein, ur 30 (*) NEGATIVE mg/dL   Urobilinogen, UA 1.0  0.0 - 1.0 mg/dL   Nitrite NEGATIVE  NEGATIVE  Leukocytes, UA NEGATIVE  NEGATIVE  URINE MICROSCOPIC-ADD ON      Result Value Ref Range   Squamous Epithelial / LPF RARE  RARE   WBC, UA 0-2  <3 WBC/hpf   RBC / HPF 0-2  <3 RBC/hpf   Bacteria, UA RARE  RARE   Urine-Other MUCOUS PRESENT    CBG MONITORING, ED      Result Value Ref Range   Glucose-Capillary 352 (*) 70 - 99 mg/dL  CBG MONITORING, ED      Result Value Ref Range   Glucose-Capillary 297 (*) 70 - 99 mg/dL  CBG MONITORING, ED      Result Value Ref Range   Glucose-Capillary 275 (*) 70 - 99 mg/dL   No results found.    Imaging Review No results found.   EKG Interpretation None       Date: 06/24/2013  Rate: 103  Rhythm: sinus tachycardia  QRS Axis: normal  Intervals: normal  ST/T Wave abnormalities: nonspecific T wave changes  Conduction Disutrbances:none  Narrative Interpretation:   Old EKG Reviewed: unchanged   MDM   Final diagnoses:  Diabetes mellitus due to underlying condition without complications    Patient presents with elevated blood sugar in the upper 300s. He is not acidotic. He's otherwise well-appearing with no vomiting or other significant symptoms. He was given 2 L IV fluids and his sugars come down to 275. I will go ahead and start him on metformin. He did advise him that he needs to have close followup for diabetes management. I spoke with his primary care physician, Dr. Alberteen Sam, who will see the patient in the office tomorrow. Patient is to call and schedule an appointment for tomorrow.    Rolan Bucco, MD 06/24/13 1410

## 2013-06-24 NOTE — ED Notes (Signed)
Pt sts fasting blood sugar at home was 354 today, he sts he used his mother's meter to check it. Denies h/o DM but does a family hx. Pt also sts vision has been blurry x 2 days and reports urinary frequency and polydipsia.

## 2013-06-24 NOTE — Discharge Instructions (Signed)

## 2013-06-24 NOTE — ED Notes (Signed)
MD at bedside. 

## 2013-06-25 ENCOUNTER — Ambulatory Visit: Payer: 59 | Admitting: Family Medicine

## 2013-06-25 LAB — C-PEPTIDE: C-Peptide: 2.66 ng/mL (ref 0.80–3.90)

## 2013-06-27 ENCOUNTER — Ambulatory Visit (INDEPENDENT_AMBULATORY_CARE_PROVIDER_SITE_OTHER): Payer: 59 | Admitting: Family Medicine

## 2013-06-27 ENCOUNTER — Encounter: Payer: Self-pay | Admitting: Family Medicine

## 2013-06-27 VITALS — BP 135/94 | HR 97 | Resp 16 | Wt 292.0 lb

## 2013-06-27 DIAGNOSIS — IMO0001 Reserved for inherently not codable concepts without codable children: Secondary | ICD-10-CM

## 2013-06-27 DIAGNOSIS — R110 Nausea: Secondary | ICD-10-CM

## 2013-06-27 DIAGNOSIS — E1165 Type 2 diabetes mellitus with hyperglycemia: Principal | ICD-10-CM

## 2013-06-27 MED ORDER — LIRAGLUTIDE 18 MG/3ML ~~LOC~~ SOPN: 1.8000 mg | PEN_INJECTOR | Freq: Every day | SUBCUTANEOUS | Status: DC

## 2013-06-27 MED ORDER — INSULIN DETEMIR 100 UNIT/ML FLEXPEN: 50.0000 [IU] | PEN_INJECTOR | Freq: Every day | SUBCUTANEOUS | Status: DC

## 2013-06-27 MED ORDER — INSULIN PEN NEEDLE 32G X 6 MM MISC: 2.0000 "pen " | Freq: Every day | Status: AC

## 2013-06-27 MED ORDER — LANCETS MISC: 1.0000 | Freq: Three times a day (TID) | Status: AC

## 2013-06-27 MED ORDER — CANAGLIFLOZIN-METFORMIN HCL 150-500 MG PO TABS: 1.0000 | ORAL_TABLET | Freq: Two times a day (BID) | ORAL | Status: DC

## 2013-06-27 MED ORDER — GLUCOSE BLOOD VI STRP: ORAL_STRIP | Status: AC

## 2013-06-27 MED ORDER — ONDANSETRON 8 MG PO TBDP: 8.0000 mg | ORAL_TABLET | Freq: Three times a day (TID) | ORAL | Status: AC | PRN

## 2013-06-27 NOTE — Progress Notes (Signed)
Subjective:    Patient ID: Eddie RoyaltyBobby Robak, male    DOB: 10-Oct-1985, 28 y.o.   MRN: [id number]  HPI  Reita ClicheBobby is here today to follow up from his recent ED visit. He was given the new diagnosis of Type II DM with a blood sugar of 384 and an A1c of 9.7 which is up from 5.1 one year ago.  The ED doctor started him on metformin 500 mg BID. We received a call letting us know how high his sugar was so we brought him up to the office right after the left the ED and got him started on insulin (Levemir 25 units.  He has been keeping an eye on his sugars which have remained over 200. He is still having some blurred vision. He also has an FMLA form he needs completed.    Review of Systems  Constitutional: Negative for activity change, appetite change and fatigue.  Eyes: Positive for visual disturbance.  Cardiovascular: Negative for chest pain, palpitations and leg swelling.  Endocrine: Positive for polyphagia. Negative for polydipsia and polyuria.  Psychiatric/Behavioral: Negative for behavioral problems and sleep disturbance. The patient is not nervous/anxious.   All other systems reviewed and are negative.   Past Medical History  Diagnosis Date  . Hyperlipidemia   . Morbid obesity   . Hypertension   . Diabetes mellitus without complication      Past Surgical History  Procedure Laterality Date  . 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 Diabetes Mother   . Hypertension Mother   .  Hypertension Father   . Cancer Father     Prostate   . Stroke Father   . Diabetes Maternal Grandmother      Current Outpatient Prescriptions on File Prior to Visit  Medication Sig Dispense Refill  . atenolol-chlorthalidone (TENORETIC) 50-25 MG per tablet Take 1 tablet by mouth daily.  90 tablet  3  . hydrochlorothiazide (HYDRODIURIL) 25 MG tablet Take 1 tablet (25 mg total) by mouth daily.  30 tablet  0  . metFORMIN (GLUCOPHAGE) 500 MG tablet Take 1 tablet (500 mg total) by mouth 2 (two) times daily with a meal.  60 tablet  0   No current facility-administered medications on file prior to visit.     No Known Allergies   Immunization History  Administered Date(s) Administered  . Tdap 12/11/2012       Objective:   Physical Exam  Vitals reviewed. Constitutional: He is oriented to person, place, and time. He appears well-nourished. No distress.  HENT:  Head: Normocephalic.  Eyes: No scleral icterus.  Neck: Neck supple. No thyromegaly present.  Cardiovascular: Normal rate, regular rhythm and normal heart sounds.  Exam reveals no gallop and no friction rub.  No murmur heard. Pulmonary/Chest: Breath sounds normal. No respiratory distress. He exhibits no tenderness.  Musculoskeletal: He exhibits no edema.  Neurological: He is alert and oriented to person, place, and time.  Skin: Skin is warm and dry. No rash noted.  Psychiatric: He has a normal mood and affect. His behavior is normal. Judgment and thought content normal.      Assessment & Plan:  Reita ClicheBobby was seen today for diabetes.  His sugars have started to improve. He is to work harder on diet and exercise.  He was given a handout about how we are going to try to get him off insulin.  He is being sent for diabetic education.     Diagnoses and associated orders for this visit:  Type II or unspecified type diabetes mellitus without mention of complication, uncontrolled - Canagliflozin-Metformin HCl (INVOKAMET) 150-500 MG  TABS; Take 1 tablet by mouth 2 (two) times daily. - Insulin Detemir (LEVEMIR FLEXPEN) 100 UNIT/ML Pen; Inject 50 Units into the skin daily at 10 pm. - Liraglutide (VICTOZA) 18 MG/3ML SOPN; Inject 1.8 mg into the skin at bedtime. - Insulin Pen Needle (NOVOFINE) 32G X 6 MM MISC; Inject 2 pens into the skin daily. - Ambulatory referral to diabetic education - glucose blood test strip; Check sugars up to 3 times per day - Lancets MISC; 1 each by Does not apply route 3 (three) times daily.  Nausea alone - ondansetron (ZOFRAN-ODT) 8 MG disintegrating tablet; Take 1 tablet (8 mg total) by mouth every 8 (eight) hours as needed for nausea.

## 2013-06-27 NOTE — Patient Instructions (Addendum)
1)  Type II DM - Add the Victoza .6 mg at betime along with the Levemir.  Stop the Kombiglyze and take the metformin 500 mg twice a day.  Slowly increase the Victoza to 1.2 then 1.8.  If you feel nauseated on the Victoza you can take some Zofran as needed.  Once you are doing fine on the Victoza and metformin then you can add the Invokamet which will replace the metformin.  Start with the Invokamet 50/1000 in the am along with the Victoza and Levemir at night.  Now, as your sugars are improving, you can back down the Levemir and hopefully eventually stop it.  Once you are done the Invokamet sample, you will get the prescription which will be for the 150/500 that you will take twice a day.  Download My Fitness Pal to keep track of your calories and exercise (1500 calories and 1 hour of exercise per day).  We'll recheck your A1c in 3 months. The goal is to have your A1c < 7.          How to Avoid Diabetes Problems You can do a lot to prevent or slow down diabetes problems. Following your diabetes plan and taking care of yourself can reduce your risk of serious or life-threatening complications. Below, you will find certain things you can do to prevent diabetes problems. MANAGE YOUR DIABETES Follow your caregiver's, nurse educator's, and dietitian's instructions for managing your diabetes. They will teach you the basics of diabetes care. They can help answer questions you may have. Learn about diabetes and make healthy choices regarding eating and physical activity. Monitor your blood glucose level regularly. Your caregiver will help you decide how often to check your blood glucose level depending on your treatment goals and how well you are meeting them.  DO NOT SMOKE Smoking and diabetes are a dangerous combination. Smoking raises your risk for diabetes problems. If you quit smoking, you will lower your risk for heart attack, stroke, nerve disease, and kidney disease. Your cholesterol and your blood pressure  levels may improve. Your blood circulation will also improve. If you smoke, ask your caregiver for help in quitting. KEEP YOUR BLOOD PRESSURE UNDER CONTROL Keeping your blood pressure under control will help prevent damage to your eyes, kidneys, heart, and blood vessels. Blood pressure consists of two numbers. The top number should be below 120, and the bottom number should be below 80 (120/80). Keep your blood pressure as close to these numbers as you can. If you already have kidney disease, you may want even lower blood pressure to protect your kidneys. Talk to your caregiver to make sure that your blood pressure goal is right for your needs. Meal planning, medicines, and exercise can help you reach your blood pressure target. Have your blood pressure checked at every visit with your caregiver. KEEP YOUR CHOLESTEROL UNDER CONTROL Normal cholesterol levels will help prevent heart disease and stroke. These are the biggest health problems for people with diabetes. Keeping cholesterol levels under control can also help with blood flow. Have your cholesterol level checked at least once a year. Meal planning, exercise, and medicines can help you reach your cholesterol targets. SCHEDULE AND KEEP YOUR ANNUAL PHYSICAL EXAMS AND EYE EXAMS Your caregiver will tell you how often he or she wants to see you depending on your plan of treatment. It is important that you keep these appointments so that possible problems can be identified early and complications can be avoided or treated.  Every  visit with your caregiver should include your weight, blood pressure, and an evaluation of your blood glucose control.  Your hemoglobin A1c should be checked:  At least twice a year if you are at your goal.  Every 3 months if there are changes in treatment.  If you are not meeting your goals.  Your blood lipids should be checked yearly. You should also be checked yearly to see if you have protein in your urine  (microalbumin).  Schedule a dilated eye exam if you have type 1 diabetes within 5 years of your diagnosis and then yearly. Schedule a dilated eye exam if you have type 2 diabetes at diagnosis and then yearly. All exams thereafter can be extended to every 2 to 3 years if one or more exams have been normal. KEEP YOUR VACCINES CURRENT The flu vaccine is recommended yearly. The formula for the vaccine changes every year and needs to be updated for the best protection against current viruses. In addition, you should get a vaccination against pneumonia at least once in your life. However, there are some instances where another vaccine is recommended. Check with your caregiver. TAKE CARE OF YOUR FEET  Diabetes may cause you to have a poor blood supply (circulation) to your legs and feet. Because of this, the skin may be thinner, break easier, and heal more slowly. You also may have nerve damage in your legs and feet causing decreased feeling. You may not notice minor injuries to your feet that could lead to serious problems or infections. Taking care of your feet is very important. Visual foot exams are performed at every routine medical visit. The exams check for cuts, injuries, or other problems with the feet. A comprehensive foot exam should be done yearly. This includes visual inspection as well as assessing foot pulses and testing for loss of sensation. You should also do the following:  Inspect your feet daily for cuts, calluses, blisters, ingrown toenails, and signs of infection, such as redness, swelling, or pus.  Wash and dry your feet thoroughly, especially between the toes.  Avoid soaking your feet regularly in hot water baths.  Moisturize dry skin with lotion, avoiding areas between your toes.  Cut toenails straight across and file the edges.  Avoid shoes that do not fit well or have areas that irritate your skin.  Avoid going barefooted or wearing only socks. Your feet need  protection. TAKE CARE OF YOUR TEETH People with poorly controlled diabetes are more likely to have gum (periodontal) disease. These infections make diabetes harder to control. Periodontal diseases, if left untreated, can lead to tooth loss. Brush your teeth twice a day, floss, and see your dentist for checkups and cleaning every 6 months, or 2 times a year. ASK YOUR CAREGIVER ABOUT TAKING ASPIRIN Taking aspirin daily is recommended to help prevent cardiovascular disease in people with and without diabetes. Ask your caregiver if this would benefit you and what dose he or she would recommend. DRINK RESPONSIBLY Moderate amounts of alcohol (less than 1 drink per day for adult women and less than 2 drinks per day for adult men) have a minimal effect on blood glucose if ingested with food. It is important to eat food with alcohol to avoid hypoglycemia. People should avoid alcohol if they have a history of alcohol abuse or dependence, if they are pregnant, and if they have liver disease, pancreatitis, advanced neuropathy, or severe hypertriglyceridemia. LESSEN STRESS Living with diabetes can be stressful. When you are under stress, your  blood glucose may be affected in two ways:  Stress hormones may cause your blood glucose to rise.  You may be distracted from taking good care of yourself. It is a good idea to be aware of your stress level and make changes that are necessary to help you better manage challenging situations. Support groups, planned relaxation, a hobby you enjoy, meditation, healthy relationships, and exercise all work to lower your stress level. If your efforts do not seem to be helping, get help from your caregiver or a trained mental health professional. Document Released: 09/06/2010 Document Revised: 12/06/2011 Document Reviewed: 09/06/2010 [facility] Patient Information 2015 [place], [place]. This information is not intended to replace advice given to you by your health care Cheynne Virden. Make  sure you discuss any questions you have with your health care Arly Salminen.   Type 2 Diabetes Mellitus, Adult Type 2 diabetes mellitus, often simply referred to as type 2 diabetes, is a long-lasting (chronic) disease. In type 2 diabetes, the pancreas does not make enough insulin (a hormone), the cells are less responsive to the insulin that is made (insulin resistance), or both. Normally, insulin moves sugars from food into the tissue cells. The tissue cells use the sugars for energy. The lack of insulin or the lack of normal response to insulin causes excess sugars to build up in the blood instead of going into the tissue cells. As a result, high blood sugar (hyperglycemia) develops. The effect of high sugar (glucose) levels can cause many complications. Type 2 diabetes was also previously called adult-onset diabetes but it can occur at any age.  RISK FACTORS  A person is predisposed to developing type 2 diabetes if someone in the family has the disease and also has one or more of the following primary risk factors:  Overweight.  An inactive lifestyle.  A history of consistently eating high-calorie foods. Maintaining a normal weight and regular physical activity can reduce the chance of developing type 2 diabetes. SYMPTOMS  A person with type 2 diabetes may not show symptoms initially. The symptoms of type 2 diabetes appear slowly. The symptoms include:  Increased thirst (polydipsia).  Increased urination (polyuria).  Increased urination during the night (nocturia).  Weight loss. This weight loss may be rapid.  Frequent, recurring infections.  Tiredness (fatigue).  Weakness.  Vision changes, such as blurred vision.  Fruity smell to your breath.  Abdominal pain.  Nausea or vomiting.  Cuts or bruises which are slow to heal.  Tingling or numbness in the hands or feet. DIAGNOSIS Type 2 diabetes is frequently not diagnosed until complications of diabetes are present. Type 2  diabetes is diagnosed when symptoms or complications are present and when blood glucose levels are increased. Your blood glucose level may be checked by one or more of the following blood tests:  A fasting blood glucose test. You will not be allowed to eat for at least 8 hours before a blood sample is taken.  A random blood glucose test. Your blood glucose is checked at any time of the day regardless of when you ate.  A hemoglobin A1c blood glucose test. A hemoglobin A1c test provides information about blood glucose control over the previous 3 months.  An oral glucose tolerance test (OGTT). Your blood glucose is measured after you have not eaten (fasted) for 2 hours and then after you drink a glucose-containing beverage. TREATMENT   You may need to take insulin or diabetes medicine daily to keep blood glucose levels in the desired range.  If you use insulin, you may need to adjust the dosage depending on the carbohydrates that you eat with each meal or snack. The treatment goal is to maintain the before meal blood sugar (preprandial glucose) level at 70-130 mg/dL. HOME CARE INSTRUCTIONS   Have your hemoglobin A1c level checked twice a year.  Perform daily blood glucose monitoring as directed by your health care Sammi Stolarz.  Monitor urine ketones when you are ill and as directed by your health care Rayman Petrosian.  Take your diabetes medicine or insulin as directed by your health care Yunique Dearcos to maintain your blood glucose levels in the desired range.  Never run out of diabetes medicine or insulin. It is needed every day.  If you are using insulin, you may need to adjust the amount of insulin given based on your intake of carbohydrates. Carbohydrates can raise blood glucose levels but need to be included in your diet. Carbohydrates provide vitamins, minerals, and fiber which are an essential part of a healthy diet. Carbohydrates are found in fruits, vegetables, whole grains, dairy products,  legumes, and foods containing added sugars.  Eat healthy foods. You should make an appointment to see a registered dietitian to help you create an eating plan that is right for you.  Lose weight if overweight.  Carry a medical alert card or wear your medical alert jewelry.  Carry a 15 gram carbohydrate snack with you at all times to treat low blood glucose (hypoglycemia). Some examples of 15 gram carbohydrate snacks include:  Glucose tablets, 3 or 4  Raisins, 2 tablespoons (24 grams)  Jelly beans, 6  Animal crackers, 8  Regular pop, 4 ounces (120 mL)  Gummy treats, 9  Recognize hypoglycemia. Hypoglycemia occurs with blood glucose levels of 70 mg/dL and below. The risk for hypoglycemia increases when fasting or skipping meals, during or after intense exercise, and during sleep. Hypoglycemia symptoms can include:  Tremors or shakes.  Decreased ability to concentrate.  Sweating.  Increased heart rate.  Headache.  Dry mouth.  Hunger.  Irritability.  Anxiety.  Restless sleep.  Altered speech or coordination.  Confusion.  Treat hypoglycemia promptly. If you are alert and able to safely swallow, follow the 15:15 rule:  Take 15-20 grams of rapid-acting glucose or carbohydrate. Rapid-acting options include glucose gel, glucose tablets, or 4 ounces (120 mL) of fruit juice, regular soda, or low fat milk.  Check your blood glucose level 15 minutes after taking the glucose.  Take 15-20 grams more of glucose if the repeat blood glucose level is still 70 mg/dL or below.  Eat a meal or snack within 1 hour once blood glucose levels return to normal.  Be alert to feeling very thirsty and urinating more frequently than usual, which are early signs of hyperglycemia. An early awareness of hyperglycemia allows for prompt treatment. Treat hyperglycemia as directed by your health care Vonceil Upshur.  Engage in at least 150 minutes of moderate-intensity physical activity a week, spread  over at least 3 days of the week or as directed by your health care Kiaraliz Rafuse. In addition, you should engage in resistance exercise at least 2 times a week or as directed by your health care Tanner Vigna.  Adjust your medicine and food intake as needed if you start a new exercise or sport.  Follow your sick day plan at any time you are unable to eat or drink as usual.  Avoid tobacco use.  Limit alcohol intake to no more than 1 drink per day for nonpregnant women and  2 drinks per day for men. You should drink alcohol only when you are also eating food. Talk with your health care Tyrika Newman whether alcohol is safe for you. Tell your health care Kyriakos Babler if you drink alcohol several times a week.  Follow up with your health care Shelita Steptoe regularly.  Schedule an eye exam soon after the diagnosis of type 2 diabetes and then annually.  Perform daily skin and foot care. Examine your skin and feet daily for cuts, bruises, redness, nail problems, bleeding, blisters, or sores. A foot exam by a health care Alaylah Heatherington should be done annually.  Brush your teeth and gums at least twice a day and floss at least once a day. Follow up with your dentist regularly.  Share your diabetes management plan with your workplace or school.  Stay up-to-date with immunizations.  Learn to manage stress.  Obtain ongoing diabetes education and support as needed.  Participate in, or seek rehabilitation as needed to maintain or improve independence and quality of life. Request a physical or occupational therapy referral if you are having foot or hand numbness or difficulties with grooming, dressing, eating, or physical activity. SEEK MEDICAL CARE IF:   You are unable to eat food or drink fluids for more than 6 hours.  You have nausea and vomiting for more than 6 hours.  Your blood glucose level is over 240 mg/dL.  There is a change in mental status.  You develop an additional serious illness.  You have diarrhea for  more than 6 hours.  You have been sick or have had a fever for a couple of days and are not getting better.  You have pain during any physical activity.  SEEK IMMEDIATE MEDICAL CARE IF:  You have difficulty breathing.  You have moderate to large ketone levels. MAKE SURE YOU:  Understand these instructions.  Will watch your condition.  Will get help right away if you are not doing well or get worse. Document Released: 12/19/2004 Document Revised: 12/24/2012 Document Reviewed: 07/18/2011 [facility] Patient Information 2015 [place], [place]. This information is not intended to replace advice given to you by your health care Metha Kolasa. Make sure you discuss any questions you have with your health care Chrishonda Hesch.

## 2013-06-29 LAB — GLUTAMIC ACID DECARBOXYLASE AUTO ABS: Glutamic Acid Decarb Ab: <1 U/mL (ref <=1.0)

## 2013-07-02 ENCOUNTER — Ambulatory Visit: Payer: 59 | Admitting: *Deleted

## 2013-07-02 LAB — INSULIN ANTIBODIES, BLOOD: Insulin Antibodies, Human: <0.4 U/mL (ref <0.4)

## 2013-07-03 LAB — ANTI-ISLET CELL ANTIBODY: Pancreatic Islet Cell Antibody: <5

## 2013-08-20 ENCOUNTER — Ambulatory Visit: Payer: 59 | Admitting: *Deleted

## 2013-08-24 DIAGNOSIS — E1165 Type 2 diabetes mellitus with hyperglycemia: Principal | ICD-10-CM

## 2013-08-24 DIAGNOSIS — IMO0001 Reserved for inherently not codable concepts without codable children: Secondary | ICD-10-CM | POA: Insufficient documentation

## 2013-08-24 DIAGNOSIS — R110 Nausea: Secondary | ICD-10-CM | POA: Insufficient documentation

## 2013-08-24 MED ORDER — CANAGLIFLOZIN-METFORMIN HCL 150-500 MG PO TABS: 1.0000 | ORAL_TABLET | Freq: Two times a day (BID) | ORAL | Status: AC

## 2013-08-24 MED ORDER — LIRAGLUTIDE 18 MG/3ML ~~LOC~~ SOPN: 1.8000 mg | PEN_INJECTOR | Freq: Every day | SUBCUTANEOUS | Status: DC

## 2013-08-24 MED ORDER — INSULIN DETEMIR 100 UNIT/ML FLEXPEN: 50.0000 [IU] | PEN_INJECTOR | Freq: Every day | SUBCUTANEOUS | Status: DC

## 2014-07-04 ENCOUNTER — Emergency Department (HOSPITAL_BASED_OUTPATIENT_CLINIC_OR_DEPARTMENT_OTHER): Payer: 59

## 2014-07-04 ENCOUNTER — Emergency Department (HOSPITAL_BASED_OUTPATIENT_CLINIC_OR_DEPARTMENT_OTHER)
Admission: EM | Admit: 2014-07-04 | Discharge: 2014-07-04 | Disposition: A | Discharge status: Home / Self Care | Payer: 59 | Attending: Emergency Medicine | Admitting: Emergency Medicine

## 2014-07-04 ENCOUNTER — Encounter (HOSPITAL_BASED_OUTPATIENT_CLINIC_OR_DEPARTMENT_OTHER): Payer: Self-pay | Admitting: *Deleted

## 2014-07-04 DIAGNOSIS — I10 Essential (primary) hypertension: Secondary | ICD-10-CM | POA: Diagnosis not present

## 2014-07-04 DIAGNOSIS — S022XXA Fracture of nasal bones, initial encounter for closed fracture: Secondary | ICD-10-CM

## 2014-07-04 DIAGNOSIS — Z794 Long term (current) use of insulin: Secondary | ICD-10-CM | POA: Insufficient documentation

## 2014-07-04 DIAGNOSIS — Y998 Other external cause status: Secondary | ICD-10-CM | POA: Insufficient documentation

## 2014-07-04 DIAGNOSIS — Y9289 Other specified places as the place of occurrence of the external cause: Secondary | ICD-10-CM | POA: Insufficient documentation

## 2014-07-04 DIAGNOSIS — S0993XA Unspecified injury of face, initial encounter: Secondary | ICD-10-CM | POA: Diagnosis present

## 2014-07-04 DIAGNOSIS — Z79899 Other long term (current) drug therapy: Secondary | ICD-10-CM | POA: Insufficient documentation

## 2014-07-04 DIAGNOSIS — Z720 Tobacco use: Secondary | ICD-10-CM | POA: Diagnosis not present

## 2014-07-04 DIAGNOSIS — E119 Type 2 diabetes mellitus without complications: Secondary | ICD-10-CM | POA: Diagnosis not present

## 2014-07-04 DIAGNOSIS — S50311A Abrasion of right elbow, initial encounter: Secondary | ICD-10-CM

## 2014-07-04 DIAGNOSIS — Y9389 Activity, other specified: Secondary | ICD-10-CM | POA: Insufficient documentation

## 2014-07-04 HISTORY — DX: Abrasion of right elbow, initial encounter: S50.311A

## 2014-07-04 HISTORY — DX: Fracture of nasal bones, initial encounter for closed fracture: S02.2XXA

## 2014-07-04 MED ORDER — LIDOCAINE HCL 2 % IJ SOLN: 5.0000 mL | Freq: Once | INTRAMUSCULAR | Status: DC

## 2014-07-04 MED ORDER — HYDROCODONE-ACETAMINOPHEN 5-325 MG PO TABS: 2.0000 | ORAL_TABLET | ORAL | Status: DC | PRN

## 2014-07-04 NOTE — Discharge Instructions (Signed)
Abrasion An abrasion is a cut or scrape of the skin. Abrasions do not extend through all layers of the skin and most heal within 10 days. It is important to care for your abrasion properly to prevent infection. CAUSES  Most abrasions are caused by falling on, or gliding across, the ground or other surface. When your skin rubs on something, the outer and inner layer of skin rubs off, causing an abrasion. DIAGNOSIS  Your caregiver will be able to diagnose an abrasion during a physical exam.  TREATMENT  Your treatment depends on how large and deep the abrasion is. Generally, your abrasion will be cleaned with water and a mild soap to remove any dirt or debris. An antibiotic ointment may be put over the abrasion to prevent an infection. A bandage (dressing) may be wrapped around the abrasion to keep it from getting dirty.  You may need a tetanus shot if:  You cannot remember when you had your last tetanus shot.  You have never had a tetanus shot.  The injury broke your skin. If you get a tetanus shot, your arm may swell, get red, and feel warm to the touch. This is common and not a problem. If you need a tetanus shot and you choose not to have one, there is a rare chance of getting tetanus. Sickness from tetanus can be serious.  HOME CARE INSTRUCTIONS   If a dressing was applied, change it at least once a day or as directed by your caregiver. If the bandage sticks, soak it off with warm water.   Wash the area with water and a mild soap to remove all the ointment 2 times a day. Rinse off the soap and pat the area dry with a clean towel.   Reapply any ointment as directed by your caregiver. This will help prevent infection and keep the bandage from sticking. Use gauze over the wound and under the dressing to help keep the bandage from sticking.   Change your dressing right away if it becomes wet or dirty.   Only take over-the-counter or prescription medicines for pain, discomfort, or fever as  directed by your caregiver.   Follow up with your caregiver within 24-48 hours for a wound check, or as directed. If you were not given a wound-check appointment, look closely at your abrasion for redness, swelling, or pus. These are signs of infection. SEEK IMMEDIATE MEDICAL CARE IF:   You have increasing pain in the wound.   You have redness, swelling, or tenderness around the wound.   You have pus coming from the wound.   You have a fever or persistent symptoms for more than 2-3 days.  You have a fever and your symptoms suddenly get worse.  You have a bad smell coming from the wound or dressing.  MAKE SURE YOU:   Understand these instructions.  Will watch your condition.  Will get help right away if you are not doing well or get worse. Document Released: 09/28/2004 Document Revised: 12/06/2011 Document Reviewed: 11/22/2010 [facility] Patient Information 2015 [place]. This information is not intended to replace advice given to you by your health care provider. Make sure you discuss any questions you have with your health care provider. Abrasion An abrasion is a cut or scrape of the skin. Abrasions do not extend through all layers of the skin and most heal within 10 days. It is important to care for your abrasion properly to prevent infection. CAUSES  Most abrasions are caused by  falling on, or gliding across, the ground or other surface. When your skin rubs on something, the outer and inner layer of skin rubs off, causing an abrasion. DIAGNOSIS  Your caregiver will be able to diagnose an abrasion during a physical exam.  TREATMENT  Your treatment depends on how large and deep the abrasion is. Generally, your abrasion will be cleaned with water and a mild soap to remove any dirt or debris. An antibiotic ointment may be put over the abrasion to prevent an infection. A bandage (dressing) may be wrapped around the abrasion to keep it from getting dirty.  You may need a  tetanus shot if:  You cannot remember when you had your last tetanus shot.  You have never had a tetanus shot.  The injury broke your skin. If you get a tetanus shot, your arm may swell, get red, and feel warm to the touch. This is common and not a problem. If you need a tetanus shot and you choose not to have one, there is a rare chance of getting tetanus. Sickness from tetanus can be serious.  HOME CARE INSTRUCTIONS   If a dressing was applied, change it at least once a day or as directed by your caregiver. If the bandage sticks, soak it off with warm water.   Wash the area with water and a mild soap to remove all the ointment 2 times a day. Rinse off the soap and pat the area dry with a clean towel.   Reapply any ointment as directed by your caregiver. This will help prevent infection and keep the bandage from sticking. Use gauze over the wound and under the dressing to help keep the bandage from sticking.   Change your dressing right away if it becomes wet or dirty.   Only take over-the-counter or prescription medicines for pain, discomfort, or fever as directed by your caregiver.   Follow up with your caregiver within 24-48 hours for a wound check, or as directed. If you were not given a wound-check appointment, look closely at your abrasion for redness, swelling, or pus. These are signs of infection. SEEK IMMEDIATE MEDICAL CARE IF:   You have increasing pain in the wound.   You have redness, swelling, or tenderness around the wound.   You have pus coming from the wound.   You have a fever or persistent symptoms for more than 2-3 days.  You have a fever and your symptoms suddenly get worse.  You have a bad smell coming from the wound or dressing.  MAKE SURE YOU:   Understand these instructions.  Will watch your condition.  Will get help right away if you are not doing well or get worse. Document Released: 09/28/2004 Document Revised: 12/06/2011 Document  Reviewed: 11/22/2010 [facility] Patient Information 2015 [place], [place]. This information is not intended to replace advice given to you by your health care provider. Make sure you discuss any questions you have with your health care provider. Nasal Fracture A nasal fracture is a break or crack in the bones of the nose. A minor break usually heals in a month. You often will receive black eyes from a nasal fracture. This is not a cause for concern. The black eyes will go away over 1 to 2 weeks.  DIAGNOSIS  Your caregiver may want to examine you if you are concerned about a fracture of the nose. X-rays of the nose may not show a nasal fracture even when one is present. Sometimes your caregiver must  wait 1 to 5 days after the injury to re-check the nose for alignment and to take additional X-rays. Sometimes the caregiver must wait until the swelling has gone down. TREATMENT Minor fractures that have caused no deformity often do not require treatment. More serious fractures where bones are displaced may require surgery. This will take place after the swelling is gone. Surgery will stabilize and align the fracture. HOME CARE INSTRUCTIONS   Put ice on the injured area.  Put ice in a plastic bag.  Place a towel between your skin and the bag.  Leave the ice on for 15-20 minutes, 03-04 times a day.  Take medications as directed by your caregiver.  Only take over-the-counter or prescription medicines for pain, discomfort, or fever as directed by your caregiver.  If your nose starts bleeding, squeeze the soft parts of the nose against the center wall while you are sitting in an upright position for 10 minutes.  Contact sports should be avoided for at least 3 to 4 weeks or as directed by your caregiver. SEEK MEDICAL CARE IF:  Your pain increases or becomes severe.  You continue to have nosebleeds.  The shape of your nose does not return to normal within 5 days.  You have pus draining from the  nose. SEEK IMMEDIATE MEDICAL CARE IF:   You have bleeding from your nose that does not stop after 20 minutes of pinching the nostrils closed and keeping ice on the nose.  You have clear fluid draining from your nose.  You notice a grape-like swelling on the dividing wall between the nostrils (septum). This is a collection of blood (hematoma) that must be drained to help prevent infection.  You have difficulty moving your eyes.  You have recurrent vomiting. Document Released: 12/17/1999 Document Revised: 03/13/2011 Document Reviewed: 04/04/2010 [facility] Patient Information 2015 [place], [place]. This information is not intended to replace advice given to you by your health care provider. Make sure you discuss any questions you have with your health care provider.
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
## 2014-07-13 ENCOUNTER — Encounter (HOSPITAL_BASED_OUTPATIENT_CLINIC_OR_DEPARTMENT_OTHER): Payer: Self-pay | Admitting: *Deleted

## 2014-07-14 ENCOUNTER — Encounter (HOSPITAL_BASED_OUTPATIENT_CLINIC_OR_DEPARTMENT_OTHER): Payer: Self-pay | Admitting: *Deleted

## 2014-07-14 ENCOUNTER — Encounter (HOSPITAL_BASED_OUTPATIENT_CLINIC_OR_DEPARTMENT_OTHER)
Admission: RE | Disposition: A | Discharge status: Home / Self Care | Payer: Self-pay | Source: Ambulatory Visit | Attending: Otolaryngology

## 2014-07-14 ENCOUNTER — Ambulatory Visit (HOSPITAL_BASED_OUTPATIENT_CLINIC_OR_DEPARTMENT_OTHER): Payer: 59 | Admitting: Anesthesiology

## 2014-07-14 ENCOUNTER — Ambulatory Visit (HOSPITAL_COMMUNITY)
Admission: RE | Admit: 2014-07-14 | Discharge: 2014-07-14 | Disposition: A | Discharge status: Home / Self Care | Payer: 59 | Source: Ambulatory Visit | Attending: Otolaryngology | Admitting: Otolaryngology

## 2014-07-14 DIAGNOSIS — S022XXA Fracture of nasal bones, initial encounter for closed fracture: Secondary | ICD-10-CM | POA: Diagnosis not present

## 2014-07-14 DIAGNOSIS — W228XXA Striking against or struck by other objects, initial encounter: Secondary | ICD-10-CM | POA: Diagnosis not present

## 2014-07-14 DIAGNOSIS — F17210 Nicotine dependence, cigarettes, uncomplicated: Secondary | ICD-10-CM | POA: Insufficient documentation

## 2014-07-14 DIAGNOSIS — Y999 Unspecified external cause status: Secondary | ICD-10-CM | POA: Diagnosis not present

## 2014-07-14 DIAGNOSIS — I10 Essential (primary) hypertension: Secondary | ICD-10-CM | POA: Insufficient documentation

## 2014-07-14 DIAGNOSIS — Y9389 Activity, other specified: Secondary | ICD-10-CM | POA: Diagnosis not present

## 2014-07-14 DIAGNOSIS — Z79899 Other long term (current) drug therapy: Secondary | ICD-10-CM | POA: Insufficient documentation

## 2014-07-14 DIAGNOSIS — R9431 Abnormal electrocardiogram [ECG] [EKG]: Secondary | ICD-10-CM | POA: Diagnosis not present

## 2014-07-14 DIAGNOSIS — E119 Type 2 diabetes mellitus without complications: Secondary | ICD-10-CM | POA: Insufficient documentation

## 2014-07-14 DIAGNOSIS — Y929 Unspecified place or not applicable: Secondary | ICD-10-CM | POA: Insufficient documentation

## 2014-07-14 DIAGNOSIS — Z6839 Body mass index (BMI) 39.0-39.9, adult: Secondary | ICD-10-CM | POA: Diagnosis not present

## 2014-07-14 HISTORY — DX: Obesity, unspecified: E66.9

## 2014-07-14 HISTORY — DX: Fracture of nasal bones, initial encounter for closed fracture: S02.2XXA

## 2014-07-14 HISTORY — DX: Presence of dental prosthetic device (complete) (partial): Z97.2

## 2014-07-14 HISTORY — DX: Abrasion of right elbow, initial encounter: S50.311A

## 2014-07-14 HISTORY — PX: CLOSED REDUCTION NASAL FRACTURE: SHX5365

## 2014-07-14 HISTORY — DX: Type 2 diabetes mellitus without complications: E11.9

## 2014-07-14 LAB — POCT I-STAT, CHEM 8
BUN: 14 mg/dL (ref 6–20)
Calcium, Ion: 1.17 mmol/L (ref 1.12–1.23)
Chloride: 104 mmol/L (ref 101–111)
Creatinine, Ser: 1 mg/dL (ref 0.61–1.24)
Glucose, Bld: 98 mg/dL (ref 65–99)
HCT: 52 % (ref 39.0–52.0)
Hemoglobin: 17.7 g/dL — ABNORMAL HIGH (ref 13.0–17.0)
Potassium: 3.4 mmol/L — ABNORMAL LOW (ref 3.5–5.1)
Sodium: 142 mmol/L (ref 135–145)
TCO2: 24 mmol/L (ref 0–100)

## 2014-07-14 LAB — GLUCOSE, CAPILLARY: Glucose-Capillary: 117 mg/dL — ABNORMAL HIGH (ref 65–99)

## 2014-07-14 SURGERY — CLOSED REDUCTION, FRACTURE, NASAL BONE
Anesthesia: General | Site: Nose

## 2014-07-14 MED ORDER — PROPOFOL 10 MG/ML IV BOLUS
INTRAVENOUS | Status: DC | PRN
  Administered 2014-07-14: 300 mg via INTRAVENOUS

## 2014-07-14 MED ORDER — LACTATED RINGERS IV SOLN
INTRAVENOUS | Status: DC
Start: 2014-07-14 — End: 2014-07-14
  Administered 2014-07-14: 08:00:00 via INTRAVENOUS

## 2014-07-14 MED ORDER — OXYMETAZOLINE HCL 0.05 % NA SOLN
NASAL | Status: AC
  Filled 2014-07-14: qty 15

## 2014-07-14 MED ORDER — FENTANYL CITRATE (PF) 100 MCG/2ML IJ SOLN
50.0000 ug | INTRAMUSCULAR | Status: DC | PRN
  Administered 2014-07-14: 100 ug via INTRAVENOUS

## 2014-07-14 MED ORDER — OXYMETAZOLINE HCL 0.05 % NA SOLN
NASAL | Status: DC | PRN
  Administered 2014-07-14: 1

## 2014-07-14 MED ORDER — BACITRACIN-NEOMYCIN-POLYMYXIN 400-5-5000 EX OINT
TOPICAL_OINTMENT | CUTANEOUS | Status: AC
  Filled 2014-07-14: qty 1

## 2014-07-14 MED ORDER — SCOPOLAMINE 1 MG/3DAYS TD PT72: 1.0000 | MEDICATED_PATCH | Freq: Once | TRANSDERMAL | Status: DC | PRN

## 2014-07-14 MED ORDER — HYDROMORPHONE HCL 1 MG/ML IJ SOLN
INTRAMUSCULAR | Status: AC
  Filled 2014-07-14: qty 1

## 2014-07-14 MED ORDER — GLYCOPYRROLATE 0.2 MG/ML IJ SOLN: 0.2000 mg | Freq: Once | INTRAMUSCULAR | Status: DC | PRN

## 2014-07-14 MED ORDER — FENTANYL CITRATE (PF) 100 MCG/2ML IJ SOLN
INTRAMUSCULAR | Status: AC
  Filled 2014-07-14: qty 6

## 2014-07-14 MED ORDER — DEXAMETHASONE SODIUM PHOSPHATE 4 MG/ML IJ SOLN
INTRAMUSCULAR | Status: DC | PRN
  Administered 2014-07-14: 10 mg via INTRAVENOUS

## 2014-07-14 MED ORDER — OXYCODONE HCL 5 MG/5ML PO SOLN: 5.0000 mg | Freq: Once | ORAL | Status: AC | PRN

## 2014-07-14 MED ORDER — ONDANSETRON HCL 4 MG/2ML IJ SOLN
INTRAMUSCULAR | Status: DC | PRN
  Administered 2014-07-14: 4 mg via INTRAVENOUS

## 2014-07-14 MED ORDER — MIDAZOLAM HCL 2 MG/2ML IJ SOLN
INTRAMUSCULAR | Status: AC
  Filled 2014-07-14: qty 2

## 2014-07-14 MED ORDER — OXYCODONE HCL 5 MG PO TABS
5.0000 mg | ORAL_TABLET | Freq: Once | ORAL | Status: AC | PRN
  Administered 2014-07-14: 5 mg via ORAL

## 2014-07-14 MED ORDER — MEPERIDINE HCL 25 MG/ML IJ SOLN: 6.2500 mg | INTRAMUSCULAR | Status: DC | PRN

## 2014-07-14 MED ORDER — LIDOCAINE HCL (CARDIAC) 20 MG/ML IV SOLN
INTRAVENOUS | Status: DC | PRN
  Administered 2014-07-14: 100 mg via INTRAVENOUS

## 2014-07-14 MED ORDER — OXYCODONE HCL 5 MG PO TABS
ORAL_TABLET | ORAL | Status: AC
  Filled 2014-07-14: qty 1

## 2014-07-14 MED ORDER — MIDAZOLAM HCL 2 MG/2ML IJ SOLN
1.0000 mg | INTRAMUSCULAR | Status: DC | PRN
  Administered 2014-07-14: 2 mg via INTRAVENOUS

## 2014-07-14 MED ORDER — LIDOCAINE-EPINEPHRINE 1 %-1:100000 IJ SOLN
INTRAMUSCULAR | Status: AC
  Filled 2014-07-14: qty 1

## 2014-07-14 MED ORDER — HYDROMORPHONE HCL 1 MG/ML IJ SOLN
0.2500 mg | INTRAMUSCULAR | Status: DC | PRN
  Administered 2014-07-14 (×2): 0.5 mg via INTRAVENOUS

## 2014-07-14 SURGICAL SUPPLY — 12 items
BENZOIN TINCTURE PRP APPL 2/3 (GAUZE/BANDAGES/DRESSINGS) ×2 IMPLANT
CANISTER SUCT 1200ML W/VALVE (MISCELLANEOUS) ×2 IMPLANT
CONT SPEC 4OZ CLIKSEAL STRL BL (MISCELLANEOUS) ×2 IMPLANT
COVER MAYO STAND STRL (DRAPES) ×2 IMPLANT
GLOVE BIO SURGEON STRL SZ7.5 (GLOVE) ×2 IMPLANT
PATTIES SURGICAL .5 X3 (DISPOSABLE) ×2 IMPLANT
SHEET MEDIUM DRAPE 40X70 STRL (DRAPES) ×2 IMPLANT
SPLINT NASAL THERMO PLAST (MISCELLANEOUS) ×2 IMPLANT
SPONGE GAUZE 4X4 12PLY STER LF (GAUZE/BANDAGES/DRESSINGS) ×2 IMPLANT
STRIP CLOSURE SKIN 1/2X4 (GAUZE/BANDAGES/DRESSINGS) ×2 IMPLANT
TOWEL OR 17X24 6PK STRL BLUE (TOWEL DISPOSABLE) ×2 IMPLANT
TUBE CONNECTING 20X1/4 (TUBING) ×2 IMPLANT

## 2014-07-14 NOTE — Brief Op Note (Signed)
07/14/2014  9:07 AM  PATIENT:  Eddie [name]  29 y.o. male  PRE-OPERATIVE DIAGNOSIS:  NASAL FRACTURE   POST-OPERATIVE DIAGNOSIS:  NASAL FRACTURE   PROCEDURE:  Procedure(s): CLOSED REDUCTION NASAL FRACTURE (N/A)  SURGEON:  Surgeon(s) and Role:    * Christia Readingwight Gionni Vaca, MD - Primary  PHYSICIAN ASSISTANT:   ASSISTANTS: none   ANESTHESIA:   general  EBL:     BLOOD ADMINISTERED:none  DRAINS: none   LOCAL MEDICATIONS USED:  NONE  SPECIMEN:  No Specimen  DISPOSITION OF SPECIMEN:  N/A  COUNTS:  YES  TOURNIQUET:  * No tourniquets in log *  DICTATION: .Other Dictation: Dictation Number [id number]  PLAN OF CARE: Discharge to home after PACU  PATIENT DISPOSITION:  PACU - hemodynamically stable.   Delay start of Pharmacological VTE agent (>24hrs) due to surgical blood loss or risk of bleeding: no

## 2014-07-14 NOTE — Anesthesia Preprocedure Evaluation (Addendum)
Anesthesia Evaluation  Patient identified by MRN, date of birth, ID band Patient awake    Reviewed: Allergy & Precautions, NPO status , Patient's Chart, lab work & pertinent test results, reviewed documented beta blocker date and time   Airway Mallampati: I  TM Distance: >3 FB Neck ROM: Full    Dental  (+) Teeth Intact, Dental Advisory Given   Pulmonary Current Smoker,  breath sounds clear to auscultation        Cardiovascular hypertension, Pt. on medications and Pt. on home beta blockers Rhythm:Regular Rate:Normal     Neuro/Psych    GI/Hepatic   Endo/Other  diabetes, Well Controlled, Type 2, Oral Hypoglycemic AgentsMorbid obesity  Renal/GU      Musculoskeletal   Abdominal   Peds  Hematology   Anesthesia Other Findings   Reproductive/Obstetrics                            Anesthesia Physical Anesthesia Plan  ASA: III  Anesthesia Plan: General   Post-op Pain Management:    Induction: Intravenous  Airway Management Planned: LMA  Additional Equipment:   Intra-op Plan:   Post-operative Plan: Extubation in OR  Informed Consent: I have reviewed the patients History and Physical, chart, labs and discussed the procedure including the risks, benefits and alternatives for the proposed anesthesia with the patient or authorized representative who has indicated his/her understanding and acceptance.   Dental advisory given  Plan Discussed with: CRNA, Anesthesiologist and Surgeon  Anesthesia Plan Comments:         Anesthesia Quick Evaluation

## 2014-07-14 NOTE — Discharge Instructions (Signed)
°  Post Anesthesia Home Care Instructions  Activity: Get plenty of rest for the remainder of the day. A responsible adult should stay with you for 24 hours following the procedure.  For the next 24 hours, DO NOT: -Drive a car -Advertising copywriterperate machinery -Drink alcoholic beverages -Take any medication unless instructed by your physician -Make any legal decisions or sign important papers.  Meals: Start with liquid foods such as gelatin or soup. Progress to regular foods as tolerated. Avoid greasy, spicy, heavy foods. If nausea and/or vomiting occur, drink only clear liquids until the nausea and/or vomiting subsides. Call your physician if vomiting continues.  Special Instructions/Symptoms: Your throat may feel dry or sore from the anesthesia or the breathing tube placed in your throat during surgery. If this causes discomfort, gargle with warm salt water. The discomfort should disappear within 24 hours.  If you had a scopolamine patch placed behind your ear for the management of post- operative nausea and/or vomiting:  1. The medication in the patch is effective for 72 hours, after which it should be removed.  Wrap patch in a tissue and discard in the trash. Wash hands thoroughly with soap and water. 2. You may remove the patch earlier than 72 hours if you experience unpleasant side effects which may include dry mouth, dizziness or visual disturbances. 3. Avoid touching the patch. Wash your hands with soap and water after contact with the patch.    Post Anesthesia Home Care Instructions  Activity: Get plenty of rest for the remainder of the day. A responsible adult should stay with you for 24 hours following the procedure.  For the next 24 hours, DO NOT: -Drive a car -Advertising copywriterperate machinery -Drink alcoholic beverages -Take any medication unless instructed by your physician -Make any legal decisions or sign important papers.  Meals: Start with liquid foods such as gelatin or soup. Progress to  regular foods as tolerated. Avoid greasy, spicy, heavy foods. If nausea and/or vomiting occur, drink only clear liquids until the nausea and/or vomiting subsides. Call your physician if vomiting continues.  Special Instructions/Symptoms: Your throat may feel dry or sore from the anesthesia or the breathing tube placed in your throat during surgery. If this causes discomfort, gargle with warm salt water. The discomfort should disappear within 24 hours.  If you had a scopolamine patch placed behind your ear for the management of post- operative nausea and/or vomiting:  1. The medication in the patch is effective for 72 hours, after which it should be removed.  Wrap patch in a tissue and discard in the trash. Wash hands thoroughly with soap and water. 2. You may remove the patch earlier than 72 hours if you experience unpleasant side effects which may include dry mouth, dizziness or visual disturbances. 3. Avoid touching the patch. Wash your hands with soap and water after contact with the patch.   Call your surgeon if you experience:   1.  Fever over 101.0. 2.  Inability to urinate. 3.  Nausea and/or vomiting. 4.  Extreme swelling or bruising at the surgical site. 5.  Continued bleeding from the incision. 6.  Increased pain, redness or drainage from the incision. 7.  Problems related to your pain medication. 8. Any change in color, movement and/or sensation 9. Any problems and/or concerns

## 2014-07-14 NOTE — Anesthesia Procedure Notes (Addendum)
Procedure Name: LMA Insertion Date/Time: 07/14/2014 8:53 AM Performed by: Zenia ResidesPAYNE, Leam Madero D Pre-anesthesia Checklist: Patient identified, Emergency Drugs available, Suction available and Patient being monitored Patient Re-evaluated:Patient Re-evaluated prior to inductionOxygen Delivery Method: Circle System Utilized Preoxygenation: Pre-oxygenation with 100% oxygen Intubation Type: IV induction Ventilation: Mask ventilation without difficulty LMA: LMA inserted LMA Size: 5.0 Number of attempts: 1 Airway Equipment and Method: Bite block Placement Confirmation: positive ETCO2 Tube secured with: Tape Dental Injury: Teeth and Oropharynx as per pre-operative assessment

## 2014-07-14 NOTE — Anesthesia Postprocedure Evaluation (Signed)
  Anesthesia Post-op Note  Patient: Eddie RoyaltyBobby Knipple  Procedure(s) Performed: Procedure(s): CLOSED REDUCTION NASAL FRACTURE (N/A)  Patient Location: PACU  Anesthesia Type: General   Level of Consciousness: awake, alert  and oriented  Airway and Oxygen Therapy: Patient Spontanous Breathing  Post-op Pain: mild  Post-op Assessment: Post-op Vital signs reviewed  Post-op Vital Signs: Reviewed  Last Vitals:  Filed Vitals:   07/14/14 1016  BP: 132/84  Pulse: 73  Temp: 36.9 C  Resp: 16    Complications: No apparent anesthesia complications
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
## 2014-07-14 NOTE — Transfer of Care (Signed)
Immediate Anesthesia Transfer of Care Note  Patient: Eddie RoyaltyBobby Noto  Procedure(s) Performed: Procedure(s): CLOSED REDUCTION NASAL FRACTURE (N/A)  Patient Location: PACU  Anesthesia Type:General  Level of Consciousness: awake and alert   Airway & Oxygen Therapy: Patient Spontanous Breathing and Patient connected to face mask oxygen  Post-op Assessment: Report given to RN and Post -op Vital signs reviewed and stable  Post vital signs: Reviewed and stable  Last Vitals:  Filed Vitals:   07/14/14 0707  BP: 140/77  Pulse: 84  Temp: 37 C  Resp: 20    Complications: No apparent anesthesia complications

## 2014-07-15 ENCOUNTER — Encounter (HOSPITAL_BASED_OUTPATIENT_CLINIC_OR_DEPARTMENT_OTHER): Payer: Self-pay | Admitting: Otolaryngology
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
## 2016-06-09 ENCOUNTER — Ambulatory Visit: Payer: 59 | Admitting: Family Medicine
